# Patient Record
Sex: Female | Born: 1945 | Race: White | Hispanic: No | Marital: Married | Smoking: Never smoker
Health system: Southern US, Community
[De-identification: ages and names within clinical notes are randomized; demographics above are authoritative.]

## PROBLEM LIST (undated history)

## (undated) DIAGNOSIS — Z8701 Personal history of pneumonia (recurrent): Secondary | ICD-10-CM

## (undated) DIAGNOSIS — N184 Chronic kidney disease, stage 4 (severe): Secondary | ICD-10-CM

## (undated) DIAGNOSIS — E785 Hyperlipidemia, unspecified: Secondary | ICD-10-CM

## (undated) DIAGNOSIS — M549 Dorsalgia, unspecified: Secondary | ICD-10-CM

## (undated) DIAGNOSIS — E119 Type 2 diabetes mellitus without complications: Secondary | ICD-10-CM

## (undated) DIAGNOSIS — E039 Hypothyroidism, unspecified: Secondary | ICD-10-CM

## (undated) DIAGNOSIS — I739 Peripheral vascular disease, unspecified: Secondary | ICD-10-CM

## (undated) DIAGNOSIS — H3563 Retinal hemorrhage, bilateral: Secondary | ICD-10-CM

## (undated) DIAGNOSIS — I10 Essential (primary) hypertension: Secondary | ICD-10-CM

## (undated) DIAGNOSIS — I219 Acute myocardial infarction, unspecified: Secondary | ICD-10-CM

## (undated) DIAGNOSIS — I2510 Atherosclerotic heart disease of native coronary artery without angina pectoris: Secondary | ICD-10-CM

## (undated) DIAGNOSIS — C439 Malignant melanoma of skin, unspecified: Secondary | ICD-10-CM

## (undated) DIAGNOSIS — M797 Fibromyalgia: Secondary | ICD-10-CM

## (undated) HISTORY — PX: TOTAL ABDOMINAL HYSTERECTOMY: SHX209

## (undated) HISTORY — PX: TUMOR REMOVAL: SHX12

## (undated) HISTORY — PX: BLADDER SUSPENSION: SHX72

## (undated) HISTORY — DX: Type 2 diabetes mellitus without complications: E11.9

## (undated) HISTORY — DX: Hypothyroidism, unspecified: E03.9

## (undated) HISTORY — DX: Hyperlipidemia, unspecified: E78.5

## (undated) HISTORY — DX: Peripheral vascular disease, unspecified: I73.9

## (undated) HISTORY — DX: Atherosclerotic heart disease of native coronary artery without angina pectoris: I25.10

## (undated) HISTORY — DX: Chronic kidney disease, stage 4 (severe): N18.4

## (undated) HISTORY — DX: Personal history of pneumonia (recurrent): Z87.01

## (undated) HISTORY — PX: CHOLECYSTECTOMY: SHX55

## (undated) HISTORY — PX: NEUROPLASTY / TRANSPOSITION MEDIAN NERVE AT CARPAL TUNNEL BILATERAL: SUR894

## (undated) HISTORY — PX: CORONARY ANGIOPLASTY: SHX604

## (undated) HISTORY — DX: Fibromyalgia: M79.7

## (undated) HISTORY — DX: Essential (primary) hypertension: I10

## (undated) HISTORY — PX: CORONARY STENT PLACEMENT: SHX1402

---

## 1999-12-28 ENCOUNTER — Inpatient Hospital Stay (HOSPITAL_COMMUNITY): Admission: RE | Admit: 1999-12-28 | Discharge: 1999-12-29 | Payer: Self-pay | Admitting: Cardiology

## 2001-01-29 ENCOUNTER — Ambulatory Visit (HOSPITAL_COMMUNITY): Admission: RE | Admit: 2001-01-29 | Discharge: 2001-01-30 | Payer: Self-pay | Admitting: Cardiology

## 2002-05-13 ENCOUNTER — Encounter: Payer: Self-pay | Admitting: Cardiology

## 2002-05-13 ENCOUNTER — Inpatient Hospital Stay (HOSPITAL_COMMUNITY): Admission: EM | Admit: 2002-05-13 | Discharge: 2002-05-18 | Payer: Self-pay | Admitting: Cardiology

## 2003-10-13 ENCOUNTER — Inpatient Hospital Stay (HOSPITAL_COMMUNITY): Admission: AD | Admit: 2003-10-13 | Discharge: 2003-10-15 | Payer: Self-pay | Admitting: Cardiology

## 2004-04-02 ENCOUNTER — Ambulatory Visit: Payer: Self-pay | Admitting: Cardiology

## 2005-03-26 ENCOUNTER — Ambulatory Visit: Payer: Self-pay | Admitting: Cardiovascular Disease

## 2005-03-26 ENCOUNTER — Inpatient Hospital Stay (HOSPITAL_COMMUNITY): Admission: AD | Admit: 2005-03-26 | Discharge: 2005-03-27 | Payer: Self-pay | Admitting: Cardiovascular Disease

## 2005-04-11 ENCOUNTER — Ambulatory Visit: Payer: Self-pay | Admitting: Cardiology

## 2005-11-04 ENCOUNTER — Ambulatory Visit: Payer: Self-pay | Admitting: Cardiology

## 2005-11-14 ENCOUNTER — Ambulatory Visit: Payer: Self-pay | Admitting: Cardiology

## 2005-12-04 ENCOUNTER — Encounter: Payer: Self-pay | Admitting: Physician Assistant

## 2006-02-28 ENCOUNTER — Inpatient Hospital Stay (HOSPITAL_COMMUNITY): Admission: AD | Admit: 2006-02-28 | Discharge: 2006-03-02 | Payer: Self-pay | Admitting: Cardiovascular Disease

## 2006-02-28 ENCOUNTER — Ambulatory Visit: Payer: Self-pay | Admitting: Cardiology

## 2006-03-10 ENCOUNTER — Ambulatory Visit: Payer: Self-pay | Admitting: Cardiology

## 2006-04-16 ENCOUNTER — Ambulatory Visit: Payer: Self-pay

## 2006-04-22 ENCOUNTER — Ambulatory Visit: Payer: Self-pay | Admitting: Cardiology

## 2006-04-30 ENCOUNTER — Ambulatory Visit: Payer: Self-pay | Admitting: Cardiology

## 2006-05-14 ENCOUNTER — Ambulatory Visit: Payer: Self-pay | Admitting: Cardiology

## 2006-12-08 ENCOUNTER — Ambulatory Visit: Payer: Self-pay | Admitting: Cardiology

## 2006-12-15 ENCOUNTER — Ambulatory Visit: Payer: Self-pay | Admitting: Cardiology

## 2006-12-17 ENCOUNTER — Ambulatory Visit: Payer: Self-pay | Admitting: Cardiology

## 2007-03-07 IMAGING — CR DG CHEST 1V PORT
1 series · 1 of 1 positions shown · non-contrast
Comparison: 10/13/03.

CLINICAL DATA: Pre-cath. Chest pain. 
 PORTABLE CHEST - [id number] ? 03/26/05:

[view not recorded]
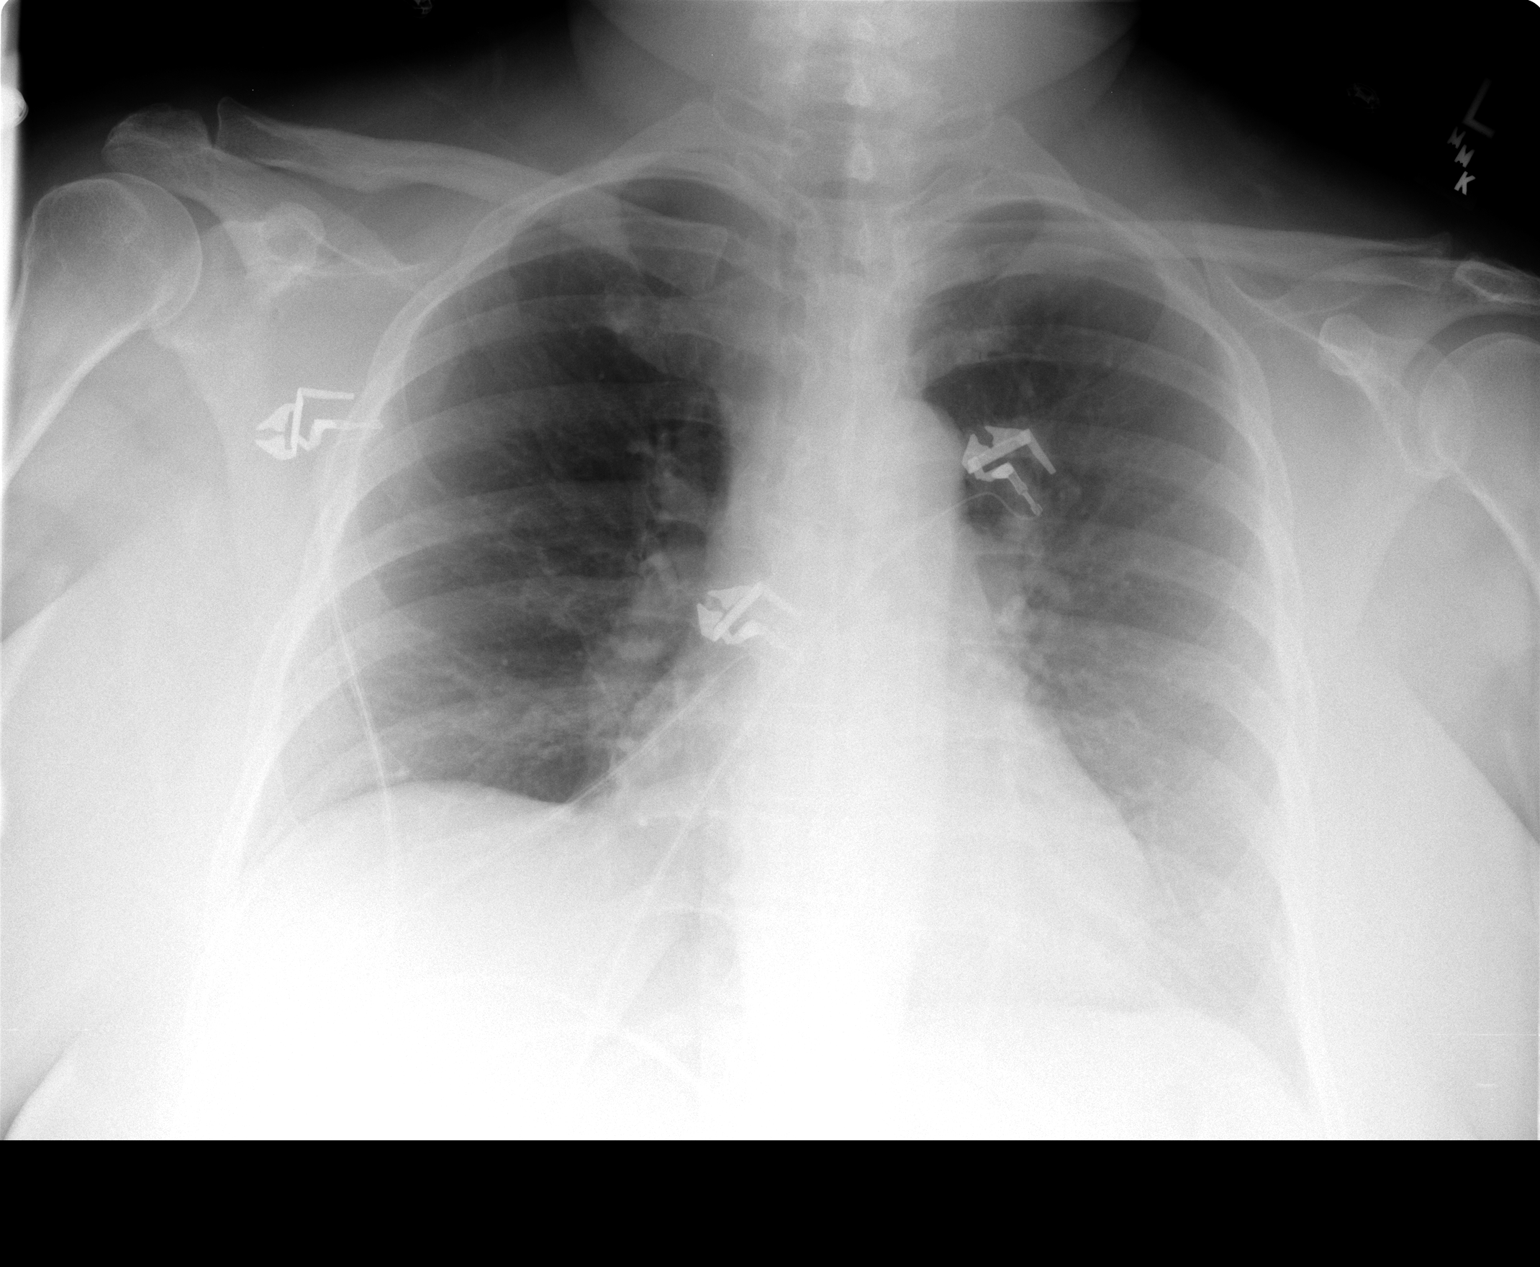

[1 of 1 positions shown; findings below may reference images not displayed]

FINDINGS: The heart size is normal. There are no effusions or edema. No focal airspace disease is noted.
IMPRESSION: No active cardiopulmonary disease.

## 2007-04-09 ENCOUNTER — Ambulatory Visit: Payer: Self-pay | Admitting: Cardiology

## 2007-09-28 ENCOUNTER — Ambulatory Visit: Payer: Self-pay | Admitting: Cardiology

## 2007-12-10 ENCOUNTER — Encounter: Payer: Self-pay | Admitting: Cardiology

## 2007-12-11 ENCOUNTER — Inpatient Hospital Stay (HOSPITAL_COMMUNITY): Admission: AD | Admit: 2007-12-11 | Discharge: 2007-12-12 | Payer: Self-pay | Admitting: Cardiovascular Disease

## 2007-12-11 ENCOUNTER — Encounter: Payer: Self-pay | Admitting: Cardiology

## 2007-12-11 ENCOUNTER — Ambulatory Visit: Payer: Self-pay | Admitting: Cardiology

## 2007-12-11 ENCOUNTER — Ambulatory Visit: Payer: Self-pay | Admitting: Cardiovascular Disease

## 2007-12-21 ENCOUNTER — Ambulatory Visit: Payer: Self-pay | Admitting: Cardiology

## 2008-02-19 ENCOUNTER — Encounter: Payer: Self-pay | Admitting: Cardiology

## 2008-04-11 ENCOUNTER — Ambulatory Visit: Payer: Self-pay | Admitting: Cardiology

## 2008-05-05 ENCOUNTER — Encounter: Payer: Self-pay | Admitting: Cardiology

## 2008-05-18 ENCOUNTER — Encounter: Payer: Self-pay | Admitting: Cardiology

## 2008-08-18 ENCOUNTER — Encounter: Payer: Self-pay | Admitting: Cardiology

## 2008-09-07 DIAGNOSIS — I10 Essential (primary) hypertension: Secondary | ICD-10-CM | POA: Insufficient documentation

## 2008-09-07 DIAGNOSIS — I2510 Atherosclerotic heart disease of native coronary artery without angina pectoris: Secondary | ICD-10-CM

## 2008-09-07 DIAGNOSIS — E782 Mixed hyperlipidemia: Secondary | ICD-10-CM

## 2008-11-17 ENCOUNTER — Encounter: Payer: Self-pay | Admitting: Cardiology

## 2008-12-08 ENCOUNTER — Encounter: Payer: Self-pay | Admitting: Cardiology

## 2009-01-20 ENCOUNTER — Ambulatory Visit: Payer: Self-pay | Admitting: Cardiology

## 2009-01-20 ENCOUNTER — Encounter: Payer: Self-pay | Admitting: Physician Assistant

## 2009-01-20 DIAGNOSIS — E119 Type 2 diabetes mellitus without complications: Secondary | ICD-10-CM

## 2009-05-11 ENCOUNTER — Telehealth (INDEPENDENT_AMBULATORY_CARE_PROVIDER_SITE_OTHER): Payer: Self-pay | Admitting: *Deleted

## 2009-05-16 ENCOUNTER — Encounter: Payer: Self-pay | Admitting: Cardiology

## 2009-05-18 ENCOUNTER — Encounter: Payer: Self-pay | Admitting: Cardiology

## 2009-07-11 ENCOUNTER — Encounter: Payer: Self-pay | Admitting: Physician Assistant

## 2009-07-11 ENCOUNTER — Telehealth (INDEPENDENT_AMBULATORY_CARE_PROVIDER_SITE_OTHER): Payer: Self-pay | Admitting: *Deleted

## 2009-07-11 ENCOUNTER — Encounter: Payer: Self-pay | Admitting: Cardiology

## 2009-07-12 ENCOUNTER — Inpatient Hospital Stay (HOSPITAL_COMMUNITY): Admission: EM | Admit: 2009-07-12 | Discharge: 2009-07-13 | Payer: Self-pay | Admitting: Internal Medicine

## 2009-07-12 ENCOUNTER — Ambulatory Visit: Payer: Self-pay | Admitting: Cardiology

## 2009-07-12 ENCOUNTER — Encounter: Payer: Self-pay | Admitting: Cardiology

## 2009-07-12 ENCOUNTER — Ambulatory Visit: Payer: Self-pay | Admitting: Cardiovascular Disease

## 2009-07-13 ENCOUNTER — Encounter: Payer: Self-pay | Admitting: Cardiology

## 2009-07-17 ENCOUNTER — Ambulatory Visit: Payer: Self-pay | Admitting: Cardiology

## 2009-08-17 ENCOUNTER — Encounter: Payer: Self-pay | Admitting: Cardiology

## 2009-11-16 ENCOUNTER — Encounter: Payer: Self-pay | Admitting: Cardiology

## 2009-11-20 ENCOUNTER — Encounter: Payer: Self-pay | Admitting: Cardiology

## 2009-11-28 ENCOUNTER — Ambulatory Visit: Payer: Self-pay | Admitting: Cardiology

## 2010-01-10 ENCOUNTER — Encounter (INDEPENDENT_AMBULATORY_CARE_PROVIDER_SITE_OTHER): Payer: Self-pay | Admitting: *Deleted

## 2010-02-06 NOTE — Consult Note (Signed)
Summary: CARDIOLOGY CONSULT/ [facility]  CARDIOLOGY CONSULT/ [facility]   Imported By: Zachary George 07/14/2009 14:44:55  _____________________________________________________________________  External Attachment:    Type:   Image     Comment:   External Document

## 2010-02-06 NOTE — Procedures (Signed)
Summary: Holter and Event/ [facility] END OF SERVICE SUMMARY REPORT  Holter and Event/ [facility] END OF SERVICE SUMMARY REPORT   Imported By: Dorise Hiss 01/19/2009 16:13:33  _____________________________________________________________________  External Attachment:    Type:   Image     Comment:   External Document

## 2010-02-06 NOTE — Assessment & Plan Note (Signed)
Summary: 6 mo fu per july reminder-srs   Visit Type:  Follow-up Primary Provider:  Dr. [name]   History of Present Illness: 65 year old woman presents for followup. She was just discharged from [facility] on 7 July after undergoing cardiac catheterization with subsequent placement of overlapping drug-eluting stents in the distal RCA. She had originally presented to [facility] with advancing chest pain symptoms.  Recent laboratory data at discharge included hemoglobin 12.5, platelets 180, potassium 4.1, BUN 11, creatinine 1.1, LDL 49, HDL 48, cholesterol 128, triglycerides 154. We reviewed her lipids. She states that she is taking both over-the-counter omega-3 supplements as well as Lovaza.  Symptomatically she feels much better. She denies any discomfort at her cardiac catheterization site.  Preventive Screening-Counseling & Management  Alcohol-Tobacco     Smoking Status: never  Current Medications (verified): 1)  Potassium Chloride Crys Cr 20 Meq Cr-Tabs (Potassium Chloride Crys Cr) .... Take One Tablet By Mouth Daily 2)  Vitamin C 500 Mg Tabs (Ascorbic Acid) .... Take 1 Tablet By Mouth Once A Day 3)  Vitamin D (Ergocalciferol) 50000 Unit Caps (Ergocalciferol) .... One Tablet By Mouth Once A Week 4)  Vitamin E 400 Unit Caps (Vitamin E) .... Take 1 Tablet By Mouth Once A Day 5)  Fish Oil 1000 Mg Caps (Omega-3 Fatty Acids) .... Take 3 By Mouth in Am and 2 At Bedtime 6)  Labetalol Hcl 100 Mg Tabs (Labetalol Hcl) .... Take 1 Tablet By Mouth Two Times A Day 7)  Lantus 100 Unit/ml Soln (Insulin Glargine) .... 42 Units At Bedtime 8)  Apidra 100 Unit/ml Soln (Insulin Glulisine) .Marland Kitchen.. 12 Units If Blood Sugar > 200 9)  Glyburide Micronized 6 Mg Tabs (Glyburide Micronized) .... Take 1 Tablet By Mouth Once A Day 10)  Metformin Hcl 1000 Mg Tabs (Metformin Hcl) .... Take 1 Tablet By Mouth Once A Day 11)  Levothyroxine Sodium 150 Mcg Tabs (Levothyroxine Sodium) ....  Take 1 Tablet By Mouth Once A Day 12)  Furosemide 80 Mg Tabs (Furosemide) .... Take 1 Tablet By Mouth Once A Day 13)  Diovan 320 Mg Tabs (Valsartan) .... Take 1 Tablet By Mouth Once A Day 14)  Clonidine Hcl 0.3 Mg Tabs (Clonidine Hcl) .... Take 1 Tablet By Mouth Two Times A Day 15)  Norvasc 10 Mg Tabs (Amlodipine Besylate) .... Take 1 Tablet By Mouth Once A Day 16)  Imdur 60 Mg Xr24h-Tab (Isosorbide Mononitrate) .... Take 1 Tablet By Mouth Once A Day 17)  Carvedilol 25 Mg Tabs (Carvedilol) .... Take 1 Tablet By Mouth Two Times A Day 18)  Plavix 75 Mg Tabs (Clopidogrel Bisulfate) .... Take One Tablet By Mouth Daily 19)  Crestor 5 Mg Tabs (Rosuvastatin Calcium) .... Take One Tablet By Mouth Daily. 20)  Aspirin Ec 325 Mg Tbec (Aspirin) .... Take 1 Tablet By Mouth Once A Day 21)  Niaspan 500 Mg Cr-Tabs (Niacin (Antihyperlipidemic)) .... Take 1 Tablet By Mouth Once A Day 22)  Nabumetone 750 Mg Tabs (Nabumetone) .... Take 1 Tablet By Mouth Two Times A Day 23)  Estrace 2 Mg Tabs (Estradiol) .... Take 1 Tablet By Mouth Once A Day 24)  Nitroglycerin 0.4 Mg Subl (Nitroglycerin) .... One Tablet Under Tongue Every 5 Minutes As Needed For Chest Pain---May Repeat Times Three 25)  Amitriptyline Hcl 100 Mg Tabs (Amitriptyline Hcl) .... Take 1 Tab By Mouth At Bedtime 26)  Lovaza 1 Gm Caps (Omega-3-Acid Ethyl Esters) .... Take 1 Tablet By Mouth Once A Day 27)  Amitriptyline Hcl 10 Mg Tabs (Amitriptyline Hcl) .... Take 1 Tablet By Mouth Once A Day At Bedtime 28)  Miralax  Powd (Polyethylene Glycol 3350) .... Take 17gm By Mouth in 8oz of Water or Juice Daily  Allergies (verified): 1)  ! Sulfa  Comments:  Nurse/Medical Assistant: The patient's medication list and allergies were reviewed with the patient and were updated in the Medication and Allergy Lists.  Past History:  Social History: Last updated: 01/20/2009 Retired  Married  Tobacco Use - No Alcohol Use - no Regular Exercise - no Drug Use -  no  Past Medical History: Fibromyalgia Hypothyroidism Diabetes type 2 CAD - BMS circ/RCA 2001, BMS RCA 2003, PTCA RCA/ramus 2004, DES RCA 2005, DES RCA 2007, DES OM/LAD 2009, DES RCA 7/11, LVEF 65% Hyperlipidemia Hypertension  Review of Systems  The patient denies anorexia, fever, syncope, dyspnea on exertion, peripheral edema, prolonged cough, hemoptysis, melena, and hematochezia.         Otherwise reviewed and negative.  Vital Signs:  Patient profile:   65 year old female Height:      62 inches Weight:      223 pounds Pulse rate:   56 / minute BP sitting:   136 / 82  (left arm) Cuff size:   large  Vitals Entered By: Carlye Grippe (July 17, 2009 9:30 AM)  Physical Exam  Additional Exam:  GEN: 65 year old female, obese, sitting upright, in no distress HEENT: NCAT,PERRLA,EOMI NECK: palpable pulses, no bruits; no JVD; no TM LUNGS: CTA bilaterally HEART: RRR (S1S2); no significant murmurs; no rubs; no gallops ABD: soft, NT; intact BS EXT: 1+ bilateral lower extremity edema SKIN: warm, dry MUSC: no obvious deformity NEURO: A/O (x3)      Cardiac Cath  Procedure date:  07/13/2009  Findings:      ANGIOGRAPHIC FINDINGS:   1. Left main coronary artery had no obstructive disease.   2. The left anterior descending had a long segment of 50% to 60%       stenosis in the proximal segment.  The mid vessel had diffuse 30%       plaque.  The distal vessel had a stent that was patent without any       restenosis.  The first 2 diagonal branches were very small in       caliber.  The third diagonal branch was a small to moderate-sized       vessel with mild plaque disease.   3. Circumflex artery had a proximal 30% stenosis.  The first obtuse       marginal was very small in caliber.  The second obtuse marginal was       moderate-to-large sized and had a patent stent.  The AV groove       circumflex bifurcated into a third obtuse marginal which had a       patent stent.  The AV  groove circumflex had a 90% ostial stenosis       just after the takeoff of the third obtuse marginal branch.  This       is most likely because of the stent that was placed across this       branch.  This is unchanged from prior catheterization.   4. The right coronary artery is a large dominant vessel with area of       diffuse stenting throughout the proximal and mid vessel.  The       stents angiographically appeared to overlap.  Beyond the  stented       segment in the distal portion of the vessel, there are two serial       80% lesions.  These lesions have progressed in severity since the       last catheterization.  The distal RCA bifurcates into the       posterolateral segment which has mild plaque disease and a small-       caliber posterior descending artery that has a 99% stenosis.  This       vessel is less than 1.0 mm and too small for intervention.  This       vessel is unchanged in appearance since the last catheterization.   5. Left ventricular angiogram was performed in the RAO projection       which showed normal left ventricular systolic function with       ejection fraction of 65%.  Impression & Recommendations:  Problem # 1:  CAD, NATIVE VESSEL (ICD-414.01)  Status post multiple percutaneous interventions, most recently with overlapping drug-eluting stent placement to the distal RCA last week. LVEF remains normal. She is symptomatically improved on medical therapy otherwise, and I plan to see her back in 3 months.  Her updated medication list for this problem includes:    Labetalol Hcl 100 Mg Tabs (Labetalol hcl) .Marland Kitchen... Take 1 tablet by mouth two times a day    Norvasc 10 Mg Tabs (Amlodipine besylate) .Marland Kitchen... Take 1 tablet by mouth once a day    Imdur 60 Mg Xr24h-tab (Isosorbide mononitrate) .Marland Kitchen... Take 1 tablet by mouth once a day    Carvedilol 25 Mg Tabs (Carvedilol) .Marland Kitchen... Take 1 tablet by mouth two times a day    Plavix 75 Mg Tabs (Clopidogrel bisulfate) .Marland Kitchen... Take  one tablet by mouth daily    Aspirin Ec 325 Mg Tbec (Aspirin) .Marland Kitchen... Take 1 tablet by mouth once a day    Nitroglycerin 0.4 Mg Subl (Nitroglycerin) ..... One tablet under tongue every 5 minutes as needed for chest pain---may repeat times three  Problem # 2:  HYPERLIPIDEMIA-MIXED (ICD-272.4)  Recent lipid profile reviewed. I have recommended that she stop Lovaza for cost savings, and otherwise continue generic omega-3 supplements. She seems to be tolerating these well. Followup lipid profile and liver function tests prior to her next visit.  The following medications were removed from the medication list:    Lovaza 1 Gm Caps (Omega-3-acid ethyl esters) .Marland Kitchen... Take 1 tablet by mouth once a day Her updated medication list for this problem includes:    Crestor 5 Mg Tabs (Rosuvastatin calcium) .Marland Kitchen... Take one tablet by mouth daily.    Niaspan 500 Mg Cr-tabs (Niacin (antihyperlipidemic)) .Marland Kitchen... Take 1 tablet by mouth once a day  Problem # 3:  HYPERTENSION, UNSPECIFIED (ICD-401.9)  Blood pressure is reasonable today.  Her updated medication list for this problem includes:    Labetalol Hcl 100 Mg Tabs (Labetalol hcl) .Marland Kitchen... Take 1 tablet by mouth two times a day    Furosemide 80 Mg Tabs (Furosemide) .Marland Kitchen... Take 1 tablet by mouth once a day    Diovan 320 Mg Tabs (Valsartan) .Marland Kitchen... Take 1 tablet by mouth once a day    Clonidine Hcl 0.3 Mg Tabs (Clonidine hcl) .Marland Kitchen... Take 1 tablet by mouth two times a day    Norvasc 10 Mg Tabs (Amlodipine besylate) .Marland Kitchen... Take 1 tablet by mouth once a day    Carvedilol 25 Mg Tabs (Carvedilol) .Marland Kitchen... Take 1 tablet by mouth two times a day  Aspirin Ec 325 Mg Tbec (Aspirin) .Marland Kitchen... Take 1 tablet by mouth once a day  Problem # 4:  DM (ICD-250.00)  Followed by Dr. Charm Barges.  Her updated medication list for this problem includes:    Lantus 100 Unit/ml Soln (Insulin glargine) .Marland Kitchen... 42 units at bedtime    Apidra 100 Unit/ml Soln (Insulin glulisine) .Marland Kitchen... 12 units if blood sugar >  200    Glyburide Micronized 6 Mg Tabs (Glyburide micronized) .Marland Kitchen... Take 1 tablet by mouth once a day    Metformin Hcl 1000 Mg Tabs (Metformin hcl) .Marland Kitchen... Take 1 tablet by mouth once a day    Diovan 320 Mg Tabs (Valsartan) .Marland Kitchen... Take 1 tablet by mouth once a day    Aspirin Ec 325 Mg Tbec (Aspirin) .Marland Kitchen... Take 1 tablet by mouth once a day  Patient Instructions: 1)  Stop Lovaza. 2)  Your physician recommends that you go to the [facility] for a FASTING lipid profile and liver function labs:  DO BEFORE OFFICE VISIT IN 3 MONTHS. 3)  Your physician wants you to follow-up in: 3 months. You will receive a reminder letter in the mail one-two months in advance. If you don't receive a letter, please call our office to schedule the follow-up appointment.

## 2010-02-06 NOTE — Assessment & Plan Note (Signed)
Summary: 3 mo fu per oct reminder   Visit Type:  Follow-up Primary Provider:  Dr. [name]   History of Present Illness: 65 year old woman presents for followup. She was seen back in July. Reports no new or progressive symptoms. She has occasional angina, not requiring nitroglycerin. Some fatigue.  She reports compliance with her medications. She had recent lab work done 114 November, hemoglobin 13.6, platelets 2:30, BUN 24, creatinine 1.2, AST 19, ALT 19, potassium 5.1, TSH 2.4. No lipids obtained.  No reported bleeding problems on dual antiplatelet therapy. Blood pressure is up today. She has not taken her morning medicines as yet. We discussed sodium restriction.  Clinical Review Panels:  Cardiac Imaging Cardiac Cath Findings ANGIOGRAPHIC FINDINGS:   1. Left main coronary artery had no obstructive disease.   2. The left anterior descending had a long segment of 50% to 60%       stenosis in the proximal segment.  The mid vessel had diffuse 30%       plaque.  The distal vessel had a stent that was patent without any       restenosis.  The first 2 diagonal branches were very small in       caliber.  The third diagonal branch was a small to moderate-sized       vessel with mild plaque disease.   3. Circumflex artery had a proximal 30% stenosis.  The first obtuse       marginal was very small in caliber.  The second obtuse marginal was       moderate-to-large sized and had a patent stent.  The AV groove       circumflex bifurcated into a third obtuse marginal which had a       patent stent.  The AV groove circumflex had a 90% ostial stenosis       just after the takeoff of the third obtuse marginal branch.  This       is most likely because of the stent that was placed across this       branch.  This is unchanged from prior catheterization.   4. The right coronary artery is a large dominant vessel with area of       diffuse stenting throughout the proximal and mid vessel.  The         stents angiographically appeared to overlap.  Beyond the stented       segment in the distal portion of the vessel, there are two serial       80% lesions.  These lesions have progressed in severity since the       last catheterization.  The distal RCA bifurcates into the       posterolateral segment which has mild plaque disease and a small-       caliber posterior descending artery that has a 99% stenosis.  This       vessel is less than 1.0 mm and too small for intervention.  This       vessel is unchanged in appearance since the last catheterization.   5. Left ventricular angiogram was performed in the RAO projection       which showed normal left ventricular systolic function with       ejection fraction of 65%. (07/13/2009)    Preventive Screening-Counseling & Management  Alcohol-Tobacco     Smoking Status: never  Current Medications (verified): 1)  Potassium Chloride Crys Cr 20 Meq Cr-Tabs (Potassium Chloride Crys Cr) .Marland KitchenMarland KitchenMarland Kitchen  Take One Tablet By Mouth Daily 2)  Vitamin C 500 Mg Tabs (Ascorbic Acid) .... Take 1 Tablet By Mouth Once A Day 3)  Vitamin D (Ergocalciferol) 50000 Unit Caps (Ergocalciferol) .... One Tablet By Mouth Once A Week 4)  Vitamin E 400 Unit Caps (Vitamin E) .... Take 1 Tablet By Mouth Once A Day 5)  Fish Oil 1000 Mg Caps (Omega-3 Fatty Acids) .... Take 3 By Mouth in Am and 2 At Bedtime 6)  Labetalol Hcl 100 Mg Tabs (Labetalol Hcl) .... Take 1 Tablet By Mouth Two Times A Day 7)  Lantus 100 Unit/ml Soln (Insulin Glargine) .... 42 Units At Bedtime 8)  Apidra 100 Unit/ml Soln (Insulin Glulisine) .Marland Kitchen.. 12 Units If Blood Sugar > 200 9)  Glyburide Micronized 6 Mg Tabs (Glyburide Micronized) .... Take 1 Tablet By Mouth Once A Day 10)  Metformin Hcl 1000 Mg Tabs (Metformin Hcl) .... Take 1 Tablet By Mouth Once A Day 11)  Levothyroxine Sodium 150 Mcg Tabs (Levothyroxine Sodium) .... Take 1 Tablet By Mouth Once A Day 12)  Furosemide 80 Mg Tabs (Furosemide) .... Take 1  Tablet By Mouth Once A Day 13)  Diovan 320 Mg Tabs (Valsartan) .... Take 1 Tablet By Mouth Once A Day 14)  Clonidine Hcl 0.3 Mg Tabs (Clonidine Hcl) .... Take 1 Tablet By Mouth Two Times A Day 15)  Norvasc 10 Mg Tabs (Amlodipine Besylate) .... Take 1 Tablet By Mouth Once A Day 16)  Imdur 60 Mg Xr24h-Tab (Isosorbide Mononitrate) .... Take 1 Tablet By Mouth Once A Day 17)  Carvedilol 25 Mg Tabs (Carvedilol) .... Take 1 Tablet By Mouth Two Times A Day 18)  Plavix 75 Mg Tabs (Clopidogrel Bisulfate) .... Take One Tablet By Mouth Daily 19)  Crestor 5 Mg Tabs (Rosuvastatin Calcium) .... Take One Tablet By Mouth Daily. 20)  Aspirin Ec 325 Mg Tbec (Aspirin) .... Take 1 Tablet By Mouth Once A Day 21)  Niaspan 500 Mg Cr-Tabs (Niacin (Antihyperlipidemic)) .... Take 1 Tablet By Mouth Once A Day 22)  Nabumetone 750 Mg Tabs (Nabumetone) .... Take 1 Tablet By Mouth Two Times A Day 23)  Estrace 2 Mg Tabs (Estradiol) .... Take 1 Tablet By Mouth Once A Day 24)  Nitroglycerin 0.4 Mg Subl (Nitroglycerin) .... One Tablet Under Tongue Every 5 Minutes As Needed For Chest Pain---May Repeat Times Three 25)  Amitriptyline Hcl 100 Mg Tabs (Amitriptyline Hcl) .... Take 1 Tab By Mouth At Bedtime 26)  Amitriptyline Hcl 10 Mg Tabs (Amitriptyline Hcl) .... Take 1 Tablet By Mouth Once A Day At Bedtime 27)  Miralax  Powd (Polyethylene Glycol 3350) .... Take 17gm By Mouth in 8oz of Water or Juice Daily 28)  Lovaza 1 Gm Caps (Omega-3-Acid Ethyl Esters) .... Take 1 Tablet By Mouth Once A Day  Allergies (verified): 1)  ! Sulfa  Comments:  Nurse/Medical Assistant: The patient's medication list and allergies were reviewed with the patient and were updated in the Medication and Allergy Lists.  Past History:  Past Medical History: Last updated: 07/17/2009 Fibromyalgia Hypothyroidism Diabetes type 2 CAD - BMS circ/RCA 2001, BMS RCA 2003, PTCA RCA/ramus 2004, DES RCA 2005, DES RCA 2007, DES OM/LAD 2009, DES RCA 7/11, LVEF  65% Hyperlipidemia Hypertension  Social History: Last updated: 01/20/2009 Retired  Married  Tobacco Use - No Alcohol Use - no Regular Exercise - no Drug Use - no  Review of Systems       The patient complains of dyspnea on exertion.  The  patient denies anorexia, fever, weight loss, syncope, peripheral edema, prolonged cough, hemoptysis, melena, and hematochezia.         Otherwise reviewed and negative except as outlined.  Vital Signs:  Patient profile:   65 year old female Height:      62 inches Weight:      215 pounds BMI:     39.47 Pulse rate:   65 / minute BP sitting:   164 / 78  (left arm) Cuff size:   large  Vitals Entered By: Carlye Grippe (November 28, 2009 8:39 AM)  Nutrition Counseling: Patient's BMI is greater than 25 and therefore counseled on weight management options.  Physical Exam  Additional Exam:  GEN: 65 year old female, obese, sitting upright, in no distress HEENT: NCAT,PERRLA,EOMI NECK: palpable pulses, no bruits; no JVD; no TM LUNGS: CTA bilaterally HEART: RRR (S1S2); no significant murmurs; no rubs; no gallops ABD: soft, NT; intact BS EXT: 1+ bilateral lower extremity edema SKIN: warm, dry MUSC: no obvious deformity NEURO: A/O (x3)      Impression & Recommendations:  Problem # 1:  CAD, NATIVE VESSEL (ICD-414.01)  Symptomatically stable at this point on medical therapy. We discussed warning signs and continued observation at this point, followup in 3 months.  Her updated medication list for this problem includes:    Labetalol Hcl 100 Mg Tabs (Labetalol hcl) .Marland Kitchen... Take 1 tablet by mouth two times a day    Norvasc 10 Mg Tabs (Amlodipine besylate) .Marland Kitchen... Take 1 tablet by mouth once a day    Imdur 60 Mg Xr24h-tab (Isosorbide mononitrate) .Marland Kitchen... Take 1 tablet by mouth once a day    Carvedilol 25 Mg Tabs (Carvedilol) .Marland Kitchen... Take 1 tablet by mouth two times a day    Plavix 75 Mg Tabs (Clopidogrel bisulfate) .Marland Kitchen... Take one tablet by mouth daily     Aspirin Ec 325 Mg Tbec (Aspirin) .Marland Kitchen... Take 1 tablet by mouth once a day    Nitroglycerin 0.4 Mg Subl (Nitroglycerin) ..... One tablet under tongue every 5 minutes as needed for chest pain---may repeat times three  Problem # 2:  HYPERTENSION, UNSPECIFIED (ICD-401.9)  Blood pressure up today, however patient has not yet taken her morning medicines. No changes made.  Her updated medication list for this problem includes:    Labetalol Hcl 100 Mg Tabs (Labetalol hcl) .Marland Kitchen... Take 1 tablet by mouth two times a day    Furosemide 80 Mg Tabs (Furosemide) .Marland Kitchen... Take 1 tablet by mouth once a day    Diovan 320 Mg Tabs (Valsartan) .Marland Kitchen... Take 1 tablet by mouth once a day    Clonidine Hcl 0.3 Mg Tabs (Clonidine hcl) .Marland Kitchen... Take 1 tablet by mouth two times a day    Norvasc 10 Mg Tabs (Amlodipine besylate) .Marland Kitchen... Take 1 tablet by mouth once a day    Carvedilol 25 Mg Tabs (Carvedilol) .Marland Kitchen... Take 1 tablet by mouth two times a day    Aspirin Ec 325 Mg Tbec (Aspirin) .Marland Kitchen... Take 1 tablet by mouth once a day  Problem # 3:  HYPERLIPIDEMIA-MIXED (ICD-272.4)  Recent liver function tests are normal. Tolerating Crestor. Plan followup lipid profile for her next visit.  Her updated medication list for this problem includes:    Crestor 5 Mg Tabs (Rosuvastatin calcium) .Marland Kitchen... Take one tablet by mouth daily.    Niaspan 500 Mg Cr-tabs (Niacin (antihyperlipidemic)) .Marland Kitchen... Take 1 tablet by mouth once a day    Lovaza 1 Gm Caps (Omega-3-acid ethyl esters) .Marland Kitchen... Take 1 tablet by  mouth once a day  Patient Instructions: 1)  Your physician wants you to follow-up in: 3 months. You will receive a reminder letter in the mail one-two months in advance. If you don't receive a letter, please call our office to schedule the follow-up appointment. 2)  Your physician recommends that you go to the [facility] for a FASTING lipid profile and liver function labs:  DO IN 3 MONTHS BEFORE YOUR APPT WITH DR. MCDOWELL. 3)  Your physician recommends  that you continue on your current medications as directed. Please refer to the Current Medication list given to you today.

## 2010-02-06 NOTE — Progress Notes (Signed)
Summary: chest pain   Phone Note Other Incoming   Caller: husband Summary of Call: c/o of wife having chest pain and pressure over last couple days.  Does have 11 stents per husband.  Also, has appt. with SM on 7/11.  Stated that she has taken one NTG this a.m. with no relief.  Advised husband to take pt. to ED for evaluation.  Husband verbalized understanding. Hoover Brunette, LPN  July 11, 2593 9:12 AM    Follow-up for Phone Call        Reviewed. Follow-up by: Loreli Slot, MD, [facility],  July 11, 2009 5:03 PM

## 2010-02-06 NOTE — Assessment & Plan Note (Signed)
Summary: 6 mo fu -srs   Visit Type:  Follow-up Primary Provider:  Dr. [name]   History of Present Illness: 65 year old woman with multivessel cardiovascular disease presents for a followup, last seen in April 2010.  Labs from November of 2010 revealed a total cholesterol of 135, LDL 46, HDL 49, triglycerides 202. BUN and creatinine were 20 and 1.4 respectively with a potassium of 4.8, AST 18, and ALT 19. TSH was 1.17. Followup labs in December 2010 showed an increase in BUN and creatinine to 25 and 1.6 respectively, potassium 4.9, hemoglobin 12.6.  Since last seen here in the clinic, Ms. Daquila reports that she is doing "really well". She denies any interim development of exertional angina pectoris. She has mild, stable exertional dyspnea. She follows regularly with Dr. Kristian Covey every 4 months, for management of chronic kidney disease and refractory hypertension. She states that her blood pressure has significantly improved, since he placed her on labetalol. es  Preventive Screening-Counseling & Management  Alcohol-Tobacco     Smoking Status: never  Current Medications (verified): 1)  Potassium Chloride Crys Cr 20 Meq Cr-Tabs (Potassium Chloride Crys Cr) .... Take One Tablet By Mouth Daily 2)  Vitamin C 500 Mg Tabs (Ascorbic Acid) .... Take 1 Tablet By Mouth Once A Day 3)  Vitamin D (Ergocalciferol) 50000 Unit Caps (Ergocalciferol) .... One Tablet By Mouth Twice A Week 4)  Vitamin E 400 Unit Caps (Vitamin E) .... Take 1 Tablet By Mouth Once A Day 5)  Fish Oil 1000 Mg Caps (Omega-3 Fatty Acids) .... Take 2 Tablet By Mouth Two Times A Day 6)  Labetalol Hcl 100 Mg Tabs (Labetalol Hcl) .... Take 1 Tablet By Mouth Two Times A Day 7)  Lantus 100 Unit/ml Soln (Insulin Glargine) .... 42 Units At Bedtime 8)  Apidra 100 Unit/ml Soln (Insulin Glulisine) .Marland Kitchen.. 12 Units If Blood Sugar > 200 9)  Glyburide Micronized 6 Mg Tabs (Glyburide Micronized) .... Take 1 Tablet By Mouth Once A Day 10)   Metformin Hcl 1000 Mg Tabs (Metformin Hcl) .... Take 1 Tablet By Mouth Once A Day 11)  Levothyroxine Sodium 150 Mcg Tabs (Levothyroxine Sodium) .... Take 1 Tablet By Mouth Once A Day 12)  Furosemide 80 Mg Tabs (Furosemide) .... Take 1 Tablet By Mouth Once A Day 13)  Diovan 320 Mg Tabs (Valsartan) .... Take 1 Tablet By Mouth Once A Day 14)  Clonidine Hcl 0.3 Mg Tabs (Clonidine Hcl) .... Take 1 Tablet By Mouth Two Times A Day 15)  Norvasc 10 Mg Tabs (Amlodipine Besylate) .... Take 1 Tablet By Mouth Once A Day 16)  Imdur 60 Mg Xr24h-Tab (Isosorbide Mononitrate) .... Take 1 Tablet By Mouth Once A Day 17)  Carvedilol 25 Mg Tabs (Carvedilol) .... Take 1 Tablet By Mouth Two Times A Day 18)  Plavix 75 Mg Tabs (Clopidogrel Bisulfate) .... Take One Tablet By Mouth Daily 19)  Crestor 5 Mg Tabs (Rosuvastatin Calcium) .... Take One Tablet By Mouth Daily. 20)  Aspirin Ec 325 Mg Tbec (Aspirin) .... Take One Tablet By Mouth Daily 21)  Niaspan 500 Mg Cr-Tabs (Niacin (Antihyperlipidemic)) .... Take 1 Tablet By Mouth Once A Day 22)  Nabumetone 750 Mg Tabs (Nabumetone) .... Take 1 Tablet By Mouth Two Times A Day 23)  Estrace 2 Mg Tabs (Estradiol) .... Take 1 Tablet By Mouth Once A Day 24)  Nitroglycerin 0.4 Mg Subl (Nitroglycerin) .... One Tablet Under Tongue Every 5 Minutes As Needed For Chest Pain---May Repeat Times Three 25)  Amitriptyline Hcl 100 Mg Tabs (Amitriptyline Hcl) .... Take 1 Tab By Mouth At Bedtime 26)  Lovaza 1 Gm Caps (Omega-3-Acid Ethyl Esters) .... Take 1 Tablet By Mouth Once A Day  Allergies: 1)  ! Sulfa  Past History:  Past Medical History: Last updated: 01/20/2009 Fibromyalgia Hypothyroidism Diabetes type 2 CAD - BMS circ/RCA 2001, BMS RCA 2003, PTCA RCA/ramus 2004, DES RCA 2005, DES RCA 2007, DES OM/LAD 2009, LVEF 65% Hyperlipidemia Hypertension  Social History: Last updated: 01/20/2009 Retired  Married  Tobacco Use - No Alcohol Use - no Regular Exercise - no Drug Use -  no  Review of Systems       No fevers, chills, hemoptysis, dysphagia, melena, hematocheezia, hematuria, rash, claudication, orthopnea, pnd. Mild, stable chronic pedal edema. All other systems reviewed, and are negative.   Vital Signs:  Patient profile:   65 year old female Height:      62 inches Weight:      219.50 pounds BMI:     40.29 Pulse rate:   49 / minute BP sitting:   177 / 78  (right arm) Cuff size:   regular  Vitals Entered By: Hoover Brunette, LPN (January 20, 2009 2:02 PM)  Nutrition Counseling: Patient's BMI is greater than 25 and therefore counseled on weight management options. Is Patient Diabetic? Yes Comments routine follow up, no problems   Physical Exam  Additional Exam:  GEN: 65 year old female, obese, sitting upright, in no distress HEENT: NCAT,PERRLA,EOMI NECK: palpable pulses, no bruits; no JVD; no TM LUNGS: CTA bilaterally HEART: RRR (S1S2); no significant murmurs; no rubs; no gallops ABD: soft, NT; intact BS EXT: 1+ bilateral lower extremity edema SKIN: warm, dry MUSC: no obvious deformity NEURO: A/O (x3)      EKG  Procedure date:  01/20/2009  Findings:      sinus bradycardia with first degree AV block at 53 bpm; normal axis; question prior septal infarct; nonspecific ST changes  Impression & Recommendations:  Problem # 1:  CAD, NATIVE VESSEL (ICD-414.01)  Patient continues to do well since undergoing drug-eluting stenting of the first obtuse marginal branch and mid LAD, December 2009, for treatment of severe 2 vessel CAD with normal LVEF (EF 65%). We'll, therefore, continue current medication regimen, but decrease aspirin initially to 162 mg daily, then 81 mg daily, indefinitely. We'll also continue Plavix for the present time, given that she has done so well. Will schedule a return clinic visit with myself and Dr. [name] in 6 months.  Problem # 2:  HYPERLIPIDEMIA-MIXED (ICD-272.4)  very well controlled on current regimen, which includes  low dose Crestor and omega-3 fish oil. Most recent data from November 2010 notable for an LDL of 46. Continued close monitoring and management, per Dr. Charm Barges, with recommended LDL target of 70, or less, if feasible.  Problem # 3:  HYPERTENSION, UNSPECIFIED (ICD-401.9)  although patient presents with an elevated blood pressure reading today, she assures me that this is generally well controlled for her, with readings of 140-150 systolic. She states that this is much improved, since being placed on labetalol, by Dr. Kristian Covey, with whom she follows regularly. We'll, therefore, defer to Dr. Kristian Covey for ongoing adjustment of her multimodel antihypertensive regimen, particularly in light of underlying chronic kidney disease. We do note, however, that she probably would not be able to tolerate an increase in labetalol, secondary to resting sinus bradycardia.  Problem # 4:  DM (ICD-250.00) Assessment: Comment Only  Other Orders: EKG w/ Interpretation (93000)  Patient Instructions:  1)  Your physician wants you to follow-up in: 6 months. You will receive a reminder letter in the mail one-two months in advance. If you don't receive a letter, please call our office to schedule the follow-up appointment. 2)  Decrease your Aspirin to 162 mg (1/2 of your 325 mg tablet) and then go down to 81mg  once daily.

## 2010-02-06 NOTE — Letter (Signed)
Summary: [facility]: Clearance to Hold Plavix  [facility]: Clearance to Hold Plavix   Imported By: Cyril Loosen, RN, BSN 05/16/2009 09:42:47  _____________________________________________________________________  External Attachment:    Type:   Image     Comment:   External Document

## 2010-02-06 NOTE — Progress Notes (Signed)
Summary: HAVING EPIDURAL/NEED STOP DATE FOR PLAVIX  Phone Note Call from Patient Call back at Home Phone [phone]   Caller: Patient Call For: nurse Summary of Call: patient called and left message on nurses machine that she is planning to have epidural and would like to know if stopping plavix five days prior to procedure is okay?  Dr. Julius Bowels is doing this and his fax number is [phone],he is pain doctor in [place]. Initial call taken by: Carlye Grippe,  May 11, 2009 3:31 PM  Follow-up for Phone Call        She should be able to stop Plavix temporarily for epidural injection as requested.  Last drug eluting stent was in 2009 by records. Follow-up by: Loreli Slot, MD, [facility],  May 12, 2009 8:07 AM  Additional Follow-up for Phone Call Additional follow up Details #1::        Patient informed of the above and this message faxed to Dr. Julius Bowels per patient request.    Additional Follow-up by: Carlye Grippe,  May 15, 2009 2:05 PM

## 2010-02-08 NOTE — Miscellaneous (Signed)
Summary: Orders Update  Clinical Lists Changes  Orders: Added new Test order of T-Lipid Profile ([id number]) - Signed Added new Test order of T-Hepatic Function ([id number]) - Signed 

## 2010-02-08 NOTE — Letter (Signed)
Summary: Generic Engineer, agricultural at [facility] [address]   [place], [place] [address]   Phone: [phone]  Fax: [phone]        January 10, 2010 MRN: [id number]    [facility] [address] [place], [place]  [address]    Dear Ms. Radoncic,   Enclosed is the order for your lab work to have done before your office visit.   Do not eat or drink after midnight.       Sincerely,  Cyril Loosen, RN, BSN  This letter has been electronically signed by your physician.

## 2010-02-19 ENCOUNTER — Encounter (INDEPENDENT_AMBULATORY_CARE_PROVIDER_SITE_OTHER): Payer: Self-pay | Admitting: *Deleted

## 2010-02-20 ENCOUNTER — Encounter: Payer: Self-pay | Admitting: Cardiology

## 2010-02-28 NOTE — Miscellaneous (Signed)
Summary: Orders Update  Clinical Lists Changes  Orders: Added new Test order of T-Lipid Profile ([id number]) - Signed Added new Test order of T-Hepatic Function ([id number]) - Signed 

## 2010-03-01 ENCOUNTER — Encounter: Payer: Self-pay | Admitting: Cardiology

## 2010-03-01 ENCOUNTER — Ambulatory Visit (INDEPENDENT_AMBULATORY_CARE_PROVIDER_SITE_OTHER): Payer: MEDICARE | Admitting: Cardiology

## 2010-03-01 DIAGNOSIS — I2510 Atherosclerotic heart disease of native coronary artery without angina pectoris: Secondary | ICD-10-CM

## 2010-03-01 DIAGNOSIS — E782 Mixed hyperlipidemia: Secondary | ICD-10-CM

## 2010-03-01 DIAGNOSIS — I10 Essential (primary) hypertension: Secondary | ICD-10-CM

## 2010-03-06 NOTE — Assessment & Plan Note (Signed)
Summary: 3 MO F/U PER REMINDER-JM   Visit Type:  Follow-up Primary Provider:  Dr. [name]   History of Present Illness: 65 year old woman presents for followup. She was seen in November 2011. She states that she has been feeling better, with more energy, no significant chest pain. No recent nitroglycerin requirement.  Lab work from 14 February showed AST 15, ALT 16, triglycerides 173, cholesterol 155, HDL 51, LDL 69. She reports compliance with her medications.  She has not been doing any regular exercise. We did talk about gradually increasing her walking.  I reviewed her last cardiac catheterization report, outlined below.  Preventive Screening-Counseling & Management  Alcohol-Tobacco     Smoking Status: never  Current Medications (verified): 1)  Potassium Chloride Crys Cr 20 Meq Cr-Tabs (Potassium Chloride Crys Cr) .... Take One Tablet By Mouth Daily 2)  Vitamin C 500 Mg Tabs (Ascorbic Acid) .... Take 1 Tablet By Mouth Once A Day 3)  Vitamin D (Ergocalciferol) 50000 Unit Caps (Ergocalciferol) .... One Tablet By Mouth Once A Week 4)  Vitamin E 400 Unit Caps (Vitamin E) .... Take 1 Tablet By Mouth Once A Day 5)  Fish Oil 1000 Mg Caps (Omega-3 Fatty Acids) .... Take 3 By Mouth in Am and 2 At Bedtime 6)  Labetalol Hcl 100 Mg Tabs (Labetalol Hcl) .... Take 1 Tablet By Mouth Two Times A Day 7)  Lantus 100 Unit/ml Soln (Insulin Glargine) .... 42 Units At Bedtime 8)  Apidra 100 Unit/ml Soln (Insulin Glulisine) .Marland Kitchen.. 12 Units If Blood Sugar > 200 9)  Glyburide Micronized 6 Mg Tabs (Glyburide Micronized) .... Take 1 Tablet By Mouth Once A Day 10)  Metformin Hcl 1000 Mg Tabs (Metformin Hcl) .... Take 1 Tablet By Mouth Once A Day 11)  Levothyroxine Sodium 150 Mcg Tabs (Levothyroxine Sodium) .... Take 1 Tablet By Mouth Once A Day 12)  Furosemide 80 Mg Tabs (Furosemide) .... Take 1 Tablet By Mouth Once A Day 13)  Diovan 320 Mg Tabs (Valsartan) .... Take 1 Tablet By Mouth Once A Day 14)   Clonidine Hcl 0.3 Mg Tabs (Clonidine Hcl) .... Take 1 Tablet By Mouth Two Times A Day 15)  Norvasc 10 Mg Tabs (Amlodipine Besylate) .... Take 1 Tablet By Mouth Once A Day 16)  Imdur 60 Mg Xr24h-Tab (Isosorbide Mononitrate) .... Take 1 Tablet By Mouth Once A Day 17)  Carvedilol 25 Mg Tabs (Carvedilol) .... Take 1 Tablet By Mouth Two Times A Day 18)  Plavix 75 Mg Tabs (Clopidogrel Bisulfate) .... Take One Tablet By Mouth Daily 19)  Crestor 5 Mg Tabs (Rosuvastatin Calcium) .... Take One Tablet By Mouth Daily. 20)  Aspirin Ec 325 Mg Tbec (Aspirin) .... Take 1 Tablet By Mouth Once A Day 21)  Niaspan 500 Mg Cr-Tabs (Niacin (Antihyperlipidemic)) .... Take 1 Tablet By Mouth Once A Day 22)  Nabumetone 750 Mg Tabs (Nabumetone) .... Take 1 Tablet By Mouth Two Times A Day 23)  Estrace 2 Mg Tabs (Estradiol) .... Take 1 Tablet By Mouth Once A Day 24)  Nitroglycerin 0.4 Mg Subl (Nitroglycerin) .... One Tablet Under Tongue Every 5 Minutes As Needed For Chest Pain---May Repeat Times Three 25)  Amitriptyline Hcl 100 Mg Tabs (Amitriptyline Hcl) .... Take 1 Tab By Mouth At Bedtime 26)  Amitriptyline Hcl 10 Mg Tabs (Amitriptyline Hcl) .... Take 1 Tablet By Mouth Once A Day At Bedtime 27)  Miralax  Powd (Polyethylene Glycol 3350) .... Take 17gm By Mouth in 8oz of Water or Juice Daily  Allergies (verified): 1)  ! Sulfa  Comments:  Nurse/Medical Assistant: The patient's medication list and allergies were reviewed with the patient and were updated in the Medication and Allergy Lists.  Past History:  Past Medical History: Last updated: 07/17/2009 Fibromyalgia Hypothyroidism Diabetes type 2 CAD - BMS circ/RCA 2001, BMS RCA 2003, PTCA RCA/ramus 2004, DES RCA 2005, DES RCA 2007, DES OM/LAD 2009, DES RCA 7/11, LVEF 65% Hyperlipidemia Hypertension  Social History: Last updated: 01/20/2009 Retired  Married  Tobacco Use - No Alcohol Use - no Regular Exercise - no Drug Use - no  Clinical Review  Panels:  Cardiac Imaging Cardiac Cath Findings ANGIOGRAPHIC FINDINGS:   1. Left main coronary artery had no obstructive disease.   2. The left anterior descending had a long segment of 50% to 60%       stenosis in the proximal segment.  The mid vessel had diffuse 30%       plaque.  The distal vessel had a stent that was patent without any       restenosis.  The first 2 diagonal branches were very small in       caliber.  The third diagonal branch was a small to moderate-sized       vessel with mild plaque disease.   3. Circumflex artery had a proximal 30% stenosis.  The first obtuse       marginal was very small in caliber.  The second obtuse marginal was       moderate-to-large sized and had a patent stent.  The AV groove       circumflex bifurcated into a third obtuse marginal which had a       patent stent.  The AV groove circumflex had a 90% ostial stenosis       just after the takeoff of the third obtuse marginal branch.  This       is most likely because of the stent that was placed across this       branch.  This is unchanged from prior catheterization.   4. The right coronary artery is a large dominant vessel with area of       diffuse stenting throughout the proximal and mid vessel.  The       stents angiographically appeared to overlap.  Beyond the stented       segment in the distal portion of the vessel, there are two serial       80% lesions.  These lesions have progressed in severity since the       last catheterization.  The distal RCA bifurcates into the       posterolateral segment which has mild plaque disease and a small-       caliber posterior descending artery that has a 99% stenosis.  This       vessel is less than 1.0 mm and too small for intervention.  This       vessel is unchanged in appearance since the last catheterization.   5. Left ventricular angiogram was performed in the RAO projection       which showed normal left ventricular systolic function with        ejection fraction of 65%. (07/13/2009)    Review of Systems       The patient complains of dyspnea on exertion.  The patient denies anorexia, fever, weight loss, chest pain, syncope, peripheral edema, prolonged cough, melena, and hematochezia.         Otherwise  reviewed and negative except as outlined.  Vital Signs:  Patient profile:   65 year old female Height:      62 inches Weight:      214 pounds Pulse rate:   65 / minute BP sitting:   153 / 80  (left arm) Cuff size:   large  Vitals Entered By: Carlye Grippe (March 01, 2010 1:05 PM)  Physical Exam  Additional Exam:  GEN: 65 year old female, obese, sitting upright, in no distress HEENT: NCAT,PERRLA,EOMI NECK: palpable pulses, no bruits; no JVD; no TM LUNGS: CTA bilaterally HEART: RRR (S1S2); no significant murmurs; no rubs; no gallops ABD: soft, NT; intact BS EXT: 1+ bilateral lower extremity edema SKIN: warm, dry MUSC: no obvious deformity NEURO: A/O (x3)      EKG  Procedure date:  03/01/2010  Findings:      Normal sinus rhythm at 61 beats per minute with low voltage and nonspecific ST changes.  Impression & Recommendations:  Problem # 1:  CAD, NATIVE VESSEL (ICD-414.01)  Symptomatically stable, continue medical therapy. Refills provided for sublingual nitroglycerin. Followup anticipated in 6 months.  Her updated medication list for this problem includes:    Labetalol Hcl 100 Mg Tabs (Labetalol hcl) .Marland Kitchen... Take 1 tablet by mouth two times a day    Norvasc 10 Mg Tabs (Amlodipine besylate) .Marland Kitchen... Take 1 tablet by mouth once a day    Imdur 60 Mg Xr24h-tab (Isosorbide mononitrate) .Marland Kitchen... Take 1 tablet by mouth once a day    Carvedilol 25 Mg Tabs (Carvedilol) .Marland Kitchen... Take 1 tablet by mouth two times a day    Plavix 75 Mg Tabs (Clopidogrel bisulfate) .Marland Kitchen... Take one tablet by mouth daily    Aspirin Ec 325 Mg Tbec (Aspirin) .Marland Kitchen... Take 1 tablet by mouth once a day    Nitroglycerin 0.4 Mg Subl (Nitroglycerin)  ..... One tablet under tongue every 5 minutes as needed for chest pain---may repeat times three  Problem # 2:  HYPERTENSION, UNSPECIFIED (ICD-401.9)  Blood pressure trend somewhat better. I encouraged regular exercise, sodium restriction, and weight loss.  Her updated medication list for this problem includes:    Labetalol Hcl 100 Mg Tabs (Labetalol hcl) .Marland Kitchen... Take 1 tablet by mouth two times a day    Furosemide 80 Mg Tabs (Furosemide) .Marland Kitchen... Take 1 tablet by mouth once a day    Diovan 320 Mg Tabs (Valsartan) .Marland Kitchen... Take 1 tablet by mouth once a day    Clonidine Hcl 0.3 Mg Tabs (Clonidine hcl) .Marland Kitchen... Take 1 tablet by mouth two times a day    Norvasc 10 Mg Tabs (Amlodipine besylate) .Marland Kitchen... Take 1 tablet by mouth once a day    Carvedilol 25 Mg Tabs (Carvedilol) .Marland Kitchen... Take 1 tablet by mouth two times a day    Aspirin Ec 325 Mg Tbec (Aspirin) .Marland Kitchen... Take 1 tablet by mouth once a day  Her updated medication list for this problem includes:    Labetalol Hcl 100 Mg Tabs (Labetalol hcl) .Marland Kitchen... Take 1 tablet by mouth two times a day    Furosemide 80 Mg Tabs (Furosemide) .Marland Kitchen... Take 1 tablet by mouth once a day    Diovan 320 Mg Tabs (Valsartan) .Marland Kitchen... Take 1 tablet by mouth once a day    Clonidine Hcl 0.3 Mg Tabs (Clonidine hcl) .Marland Kitchen... Take 1 tablet by mouth two times a day    Norvasc 10 Mg Tabs (Amlodipine besylate) .Marland Kitchen... Take 1 tablet by mouth once a day    Carvedilol 25 Mg Tabs (  Carvedilol) .Marland Kitchen... Take 1 tablet by mouth two times a day    Aspirin Ec 325 Mg Tbec (Aspirin) .Marland Kitchen... Take 1 tablet by mouth once a day  Problem # 3:  HYPERLIPIDEMIA-MIXED (ICD-272.4)  Lipid control looks good overall, LDL at goal. No changes made. Plan to followup fasting lipid profile and liver function tests for her next visit.  The following medications were removed from the medication list:    Lovaza 1 Gm Caps (Omega-3-acid ethyl esters) .Marland Kitchen... Take 1 tablet by mouth once a day Her updated medication list for this problem  includes:    Crestor 5 Mg Tabs (Rosuvastatin calcium) .Marland Kitchen... Take one tablet by mouth daily.    Niaspan 500 Mg Cr-tabs (Niacin (antihyperlipidemic)) .Marland Kitchen... Take 1 tablet by mouth once a day  Other Orders: EKG w/ Interpretation (93000)  Patient Instructions: 1)  Your physician wants you to follow-up in: 6 months. You will receive a reminder letter in the mail one-two months in advance. If you don't receive a letter, please call our office to schedule the follow-up appointment. 2)  Your physician recommends that you go to the [facility] for a FASTING lipid profile and liver function labs:  DO IN 6 MONTHS BEFORE NEXT OFFICE VISIT- Prescriptions: NITROGLYCERIN 0.4 MG SUBL (NITROGLYCERIN) One tablet under tongue every 5 minutes as needed for chest pain---may repeat times three  #25 x 3   Entered by:   Cyril Loosen, RN, BSN   Authorized by:   Loreli Slot, MD, [facility]   Signed by:   Cyril Loosen, RN, BSN on 03/01/2010   Method used:   Electronically to        [id number] (retail)       [address]       [place], [place]  [address]       Ph: [phone]       Fax: [phone]   RxID:   [id number]

## 2010-03-15 ENCOUNTER — Encounter: Payer: Self-pay | Admitting: Cardiology

## 2010-03-25 LAB — MRSA PCR SCREENING: MRSA by PCR: NEGATIVE

## 2010-03-25 LAB — CBC
HCT: 37.1 % (ref 36.0–46.0)
Hemoglobin: 12.5 g/dL (ref 12.0–15.0)
MCH: 31.3 pg (ref 26.0–34.0)
MCHC: 33.6 g/dL (ref 30.0–36.0)
MCV: 93 fL (ref 78.0–100.0)
Platelets: 180 10*3/uL (ref 150–400)
RBC: 3.99 MIL/uL (ref 3.87–5.11)
RDW: 14.6 % (ref 11.5–15.5)
WBC: 6.3 10*3/uL (ref 4.0–10.5)

## 2010-03-25 LAB — PLATELET INHIBITION P2Y12
P2Y12 % Inhibition: 60 %
Platelet Function  P2Y12: 139 [PRU] — ABNORMAL LOW (ref 194–418)
Platelet Function Baseline: 344 [PRU] (ref 194–418)

## 2010-03-25 LAB — BASIC METABOLIC PANEL
BUN: 11 mg/dL (ref 6–23)
CO2: 24 mEq/L (ref 19–32)
Calcium: 8.6 mg/dL (ref 8.4–10.5)
Chloride: 101 mEq/L (ref 96–112)
Creatinine, Ser: 1.16 mg/dL (ref 0.4–1.2)
GFR calc Af Amer: 57 mL/min — ABNORMAL LOW (ref >60)
GFR calc non Af Amer: 47 mL/min — ABNORMAL LOW (ref >60)
Glucose, Bld: 135 mg/dL — ABNORMAL HIGH (ref 70–99)
Potassium: 4.1 mEq/L (ref 3.5–5.1)
Sodium: 135 mEq/L (ref 135–145)

## 2010-03-25 LAB — GLUCOSE, CAPILLARY
Glucose-Capillary: 137 mg/dL — ABNORMAL HIGH (ref 70–99)
Glucose-Capillary: 66 mg/dL — ABNORMAL LOW (ref 70–99)
Glucose-Capillary: 72 mg/dL (ref 70–99)
Glucose-Capillary: 76 mg/dL (ref 70–99)

## 2010-03-25 LAB — LIPID PANEL
Cholesterol: 128 mg/dL (ref 0–200)
HDL: 48 mg/dL (ref >39)
LDL Cholesterol: 49 mg/dL (ref 0–99)
Total CHOL/HDL Ratio: 2.7 RATIO
Triglycerides: 154 mg/dL — ABNORMAL HIGH (ref <150)
VLDL: 31 mg/dL (ref 0–40)

## 2010-05-22 NOTE — Assessment & Plan Note (Signed)
[facility]                          [facility] OFFICE NOTE   NAME:HODGESArica, [name]                     MRN:          [id number]  DATE:04/11/2008                            DOB:          1945-05-18    PRIMARY CARE PHYSICIAN:  Dr. [name].   REASON FOR VISIT:  Routine followup.   HISTORY OF PRESENT ILLNESS:  [name] was seen back in December  following a percutaneous coronary intervention in the setting of  unstable angina.  She was noted to have patent stents in the right  coronary artery and circumflex with progressive disease in the left  anterior descending and obtuse marginal, both requiring intervention  with drug-eluting stents.  She continues to do very well without any  significant angina.  She and her husband have started walking more  regularly at the [facility].  She had very favorable looking lipids done back  in February by Dr. Charm Barges with an LDL of 66, total cholesterol of 156,  HDL 57 and triglycerides 166.  She remains on a large medication list,  although generally tolerates things well.  She states that she was seen  by Dr. Kristian Covey to exclude the possibility of renal artery stenosis and  had an overall reassuring workup.  Generally, she states that she has  been feeling good.   ALLERGIES:  SULFA drugs.   PRESENT MEDICATIONS:  1. Potassium 20 mEq p.o. q.a.m.  2. Omega-3 supplements 2000 mg p.o. b.i.d.  3. Labetalol 100 mg p.o. b.i.d.  4. Lantus 42 units subcu nightly.  5. Apidra 12 units p.r.n. blood sugar greater than 200.  6. Glyburide 6 mg p.o. q.a.m.  7. Metformin 1000 mg p.o. q.p.m.  8. Levothyroxine 150 mcg p.o. q.a.m.  9. Torsemide 20 mg p.o. q.a.m.  10.Diovan 320 mg p.o. q.a.m.  11.Clonidine 0.3 mg p.o. q.p.m.  12.Norvasc 10 mg p.o. q.a.m.  13.Imdur 60 mg p.o. q.a.m.  14.Coreg 25 mg p.o. b.i.d.  15.Plavix 75 mg p.o. q.a.m.  16.Crestor 5 mg p.o. nightly.  17.Enteric-coated aspirin 325 mg p.o. q.p.m.  18.Niaspan 500 mg p.o. q.p.m.  19.Nabumetone 750 mg p.o. b.i.d.  20.Estrace 2 mg q.a.m.  21.Sublingual nitroglycerin 0.4 mg p.r.n.   REVIEW OF SYSTEMS:  As outlined above.  She is not having any  claudication or palpitations.  Breathing is stable, NYHA class II.  Shortness of breath with activity.  No orthopnea or PND.  Otherwise,  reviewed and negative.   PHYSICAL EXAMINATION:  VITAL SIGNS:  Blood pressure is 146/90, heart  rate is 61, weight is 216 pounds, and respirations 18.  GENERAL:  This is an overweight woman in no acute distress.  NECK:  No elevated jugular venous pressure.  No loud bruits.  LUNGS:  Clear without labored breathing at rest.  CARDIAC:  Regular rate and rhythm.  No S3 gallop or pericardial rub.  EXTREMITIES:  No pitting edema.   IMPRESSION AND RECOMMENDATIONS:  1. Multivessel cardiovascular disease, status post multiple      percutaneous interventions, most recently with drug-eluting stent      placement to the left anterior descending  and obtuse marginal      system.  She has normal left ventricular systolic function.  At      this point, she remains stable symptomatically and is tolerating a      walking regimen.  We will continue medical therapy and I will see      her back for symptom observation over the next 6 months.  2. Hyperlipidemia, well controlled on present regimen.     [name], MD  Electronically Signed    SGM/MedQ  DD: 04/11/2008  DT: 04/11/2008  Job #: [id number]   cc:   [name]

## 2010-05-22 NOTE — Discharge Summary (Signed)
[name], [name]              ACCOUNT NO.:  [id number]   MEDICAL RECORD NO.:  [id number]          PATIENT TYPE:  INP   LOCATION:  6532                         FACILITY:  [facility]   PHYSICIAN:  Marca Ancona, MD      DATE OF BIRTH:  1945-07-09   DATE OF ADMISSION:  12/11/2007  DATE OF DISCHARGE:  12/12/2007                               DISCHARGE SUMMARY   DISCHARGING DIAGNOSES:  1. Coronary artery disease, status post cardiac catheterization and      percutaneous coronary intervention with Promus drug-eluting stent      to the obtuse marginal and a Taxus drug-eluting stent to the mid      left anterior descending with severe two-vessel coronary artery      disease, previously placed stent patent.  2. Extreme nausea and vomiting, postcardiac catheterization, relieved      with antiemetic.  3. Hypertension.  4. Diabetes.  5. Dyslipidemia.   PAST MEDICAL HISTORY:  Coronary artery disease, status post previous  stents to the left circumflex, bare metal stent, stent to the distal RCA  at the proximal right coronary artery, previous PCI stenting of the  right coronary artery with a bare metal stent, previous PCI, cutting  balloon of in-stent restenosis in the right coronary artery, PCI of the  ramus branch, previous PCI stenting of the proximal right mid coronary  artery with a Cypher drug-eluting stent.   HOSPITAL COURSE:  [name] is a 65 year old Caucasian female with  known history of coronary artery disease, status post previous multiple  cardiac catheterizations and interventions, who presented initially to  [facility] complaining of chest discomfort consistent with  unstable angina.  The patient was transported by [facility] to [facility]  for cardiac catheterization.  The patient with severe two-vessel disease  in the LAD and left circumflex, previously placed stents patent to the  RCA and left circumflex and preserved LV function.  She underwent  successful stenting  to the OM and LAD with drug-eluting stents by Dr.  Tonny Bollman.  The patient tolerated the procedure without  complications, but experienced significant nausea and vomiting following  the catheterization, treated with antiemetics.  On day of discharge, the  patient is stable.  No further nausea or vomiting.  Appetite good.  Ready to go home.  Potassium 4.0, creatinine 1.11, and hematocrit 38.1.  The patient is being discharged home to follow up with Dr. Andee Lineman in 1-2  weeks in [place].  She has been given the postcardiac catheterization  discharge instructions.   DISCHARGE MEDICATION:  Medication on discharge list is quite lengthy.  It includes:  1. Lantus 42 units daily.  2. Apidra 12 units for CBG greater than 200.  3. Glyburide 6 mg daily.  4. Metformin 1000 mg daily, the patient may resume this on the 7th.  5. Levothyroxine 150 mcg daily.  6. Torsemide 20 mg daily.  7. Diovan 320.  8. Clonidine 0.3.  9. Norvasc 10.  10.Imdur 60.  11.Coreg 25 b.i.d.  12.Plavix 75.  13.Crestor 5.  14.Aspirin 325.  15.Niaspan 500.  16.Amitriptyline 100.  17.Estrace 2  mg daily.  18.Potassium 20 mEq daily.  19.Lovaza 4000 mg daily.  20.Nitroglycerin as needed.  21.Nabumetone 750 mg b.i.d.  22.Multivitamins as previously taken.  23.Fish oil 2000 mg daily if not taking the Lovaza.   Of note, the patient's total cholesterol 154, triglycerides 155, HDL 56,  and LDL 67.  The patient is to follow up with Dr. Andee Lineman for further  management outpatient, consider increasing the patient's Crestor to 10.   DURATION OF DISCHARGE ENCOUNTER:  Greater than 30 minutes.      Dorian Pod, ACNP      Marca Ancona, MD  Electronically Signed    MB/MEDQ  D:  12/12/2007  T:  12/12/2007  Job:  [id number]   cc:   [name]

## 2010-05-22 NOTE — Assessment & Plan Note (Signed)
[facility]                          [facility] OFFICE NOTE   NAME:HODGESLorella, [name]                     MRN:          [id number]  DATE:04/09/2007                            DOB:          1945-11-24    PRIMARY CARDIOLOGIST:  Dr. [name].   REASON FOR VISIT:  Scheduled 20-month follow-up.  Please refer to [name] office note of December 08, 2006, for full details.   At that time, the patient had adjustment of her medication regimen with  substitution of Lasix with hydrochlorothiazide.  This has since been  increased to 50 mg a day, for more aggressive blood pressure control.   She also was referred for a 2-D echocardiogram for further evaluation of  a cardiac murmur.  The echo, however, showed no significant valvular  abnormalities, with normal left ventricular function (EF greater than  65%).   Clinically, the patient reports no recurrent angina pectoris since she  presented with acute coronary syndrome in February 2008, treated with  Taxus stenting of the distal circumflex.   CURRENT MEDICATIONS:  1. Coreg 25 mg b.i.d.  2. Hydrochlorothiazide 50 mg daily.  3. Diovan 320 mg daily.  4. Norvasc 10 mg daily.  5. Imdur 60 mg daily.  6. Plavix.  7. Full dose aspirin.  8. Potassium chloride 20 mEq daily.  9. Glynase 6 mg daily.  10.Glucophage XR 1000 mg daily.  11.Lantus 42 units daily.  12.Clonidine 0.3 mg b.i.d.  13.Lovaza 4 gm daily.  14.Fish oil 2400 q.a.m./1200 q.p.m.  15.Simcor 500/20 mg nightly.  16.Estrace.  17.Apidra 10 units daily.  18.Synthroid 0.15 mg daily.  19.Amitriptyline 100 mg nightly.  20.Nabumetone.   PHYSICAL EXAMINATION:  VITAL SIGNS:  Blood pressure 153/86, pulse 58,  regular.  Weight 217.  GENERAL:  A 65 year old female, morbidly obese, sitting upright in no  distress.  HEENT:  Normocephalic, atraumatic.  NECK:  Palpable bilateral carotid pulses without bruits; no JVD at 90  degrees.  LUNGS:  Clear to auscultation in all fields.  HEART:  Regular rate and rhythm (S1-S2).  No significant murmurs.  No  rubs.  ABDOMEN:  Protuberant, nontender with intact bowel sounds.  EXTREMITIES:  Intact distal pulses with no significant edema.  NEURO:  No focal deficit.   IMPRESSION:  1. Coronary artery disease.      a.     Status post multivessel percutaneous intervention with       numerous prior stents, most recently Taxus stenting of 100% distal       CFX, following presentation with NSTEMI, February 2008.      b.     Residual nonobstructive CAD with patent RCA stent; new 100%       occluded ramus intermedius branch.  2. Uncontrolled hypertension.      a.     Requiring multimodal medication regimen.      b.     Normal renal ultrasound with no evidence of renal artery       stenosis, April 2008.  3. Preserved left ventricular function.  4. Mixed dyslipidemia.  5. Insulin-dependent diabetes mellitus.  6. Morbid obesity   PLAN:  1. Renew prescription for p.r.n. nitroglycerin.  2. Down titrate aspirin to 162 mg daily, then 81 mg indefinitely.  The      patient is to remain on Plavix indefinitely, given her history of      numerous stents and repeat history of in-stent restenosis.  3. Aggressive lipid management, per Dr. Charm Barges, with target LDL of 70      or less.  4. Surveillance blood work with a complete metabolic profile.  It does      not appear that the patient has had follow-up blood work, after up      titration of her hydrochlorothiazide.  5. The patient strongly encouraged to initiate a regular walking      regimen, to help her both with      weight reduction as well as better blood pressure control.  6. Schedule return clinic follow-up with myself and Dr. [name] in 4      months.     [name], PA-C  Electronically Signed      [name], MD  Electronically Signed   GS/MedQ  DD: 04/09/2007  DT: 04/09/2007  Job #: [id number]   cc:   [name]

## 2010-05-22 NOTE — Cardiovascular Report (Signed)
[name]              ACCOUNT NO.:  [id number]   MEDICAL RECORD NO.:  [id number]          PATIENT TYPE:  OIB   LOCATION:  2899                         FACILITY:  [facility]   PHYSICIAN:  Veverly Fells. Excell Seltzer, MD  DATE OF BIRTH:  17-Apr-1945   DATE OF PROCEDURE:  12/11/2007  DATE OF DISCHARGE:                            CARDIAC CATHETERIZATION   PROCEDURES:  1. Left heart catheterization.  2. Selective coronary angiography.  3. Left ventricular angiography.  4. Percutaneous transluminal coronary angioplasty and stenting of the      obtuse marginal 1 and left anterior descending branches.   INDICATIONS:  [name] is a 65 year old woman with diabetes and  multiple cardiac risk factors.  She has known coronary artery disease  and has had multiple percutaneous interventions.  Her last study was  back in 2008 when she underwent stenting of severe stenosis of the left  circumflex.  She has also had multiple stents in the right coronary  artery.  She presented with typical symptoms of unstable angina and was  referred for cardiac cath.   DESCRIPTION OF PROCEDURE:  Risks and indications of procedure were  reviewed with the patient.  Informed consent was obtained.  The right  groin was prepped, draped, and anesthetized with 1% lidocaine.  Using  the modified Seldinger technique, a 5-French sheath was placed in the  right femoral artery.  Standard 5-French Judkins catheters were used for  coronary angiography and left ventriculography.  All catheter sheaths  were performed over a guidewire.  The patient tolerated the diagnostic  procedure well.   Following the diagnostic procedure, I elected to perform PCI on severe  lesions in the OM-1 branch of the left circumflex as well as the mid  LAD.  Stents in the right coronary artery and left circumflex were  widely patent.  There was a severe de novo stenosis in the first OM  branch of at least 95%.  There was also a 95-99% stenosis in  the mid LAD  involving a long segment of that vessel.  Angiomax was used for  anticoagulation.  The patient was already on maintenance Plavix.  She  was given an additional 300 mg of Plavix.  Angiomax was used for  anticoagulation.  Once the therapeutic ACT was achieved, a 6-French XB  3.5 cm guide catheter was inserted.  A Cougar wire was advanced easily  into the OM branch beyond the area of severe stenosis.  The lesion was  predilated with a 2.0 x 12 mm Apex balloon which was taken to 8  atmospheres.  Following predilatation, there was improvement in flow.  There actually was a long segment of stenosis beyond the area of  critical stenosis.  I elected to stent the entire segment with a 2.5 x  23 mm PROMUS stent.  The stent was carefully positioned and deployed at  12 atmospheres.  Following stenting, there was a residual waist in the  mid portion of the stent.  I elected to post-dilate with a 2.5 x 15 mm  Quantum-Maverick balloon which was positioned over the area of residual  stenosis  and dilated to 14 atmospheres.  There was excellent stent  expansion with post-dilatation.  At the completion of the stent  procedure, there was TIMI 3 flow in the vessel with a well-expanded  stent.  At that point, attention was turned to the LAD.  The same Cougar  guidewire was pulled back and advanced into the apical portion of the  LAD across the area of severe stenosis.  The same 2.0 x 12 mm Apex stent  was carefully positioned and inflated to 6 atmospheres.  The balloon was  removed and angiography demonstrated TIMI 3 flow in the vessel.  There  was also a long segment of stenosis and a small portion of the mid and  distal LAD.  I elected to use a 2.25 x 24 mm Taxus Adam stent which was  carefully positioned at the origin of the second diagonal branch and it  covered the entire area of disease.  The stent was deployed at 12  atmospheres.  I used the same 2.5 x 15 mm Quantum-Maverick balloon to   post-dilate.  Care was taken to make sure that the balloon remained  inside of the stent as it was slightly oversized.  A total of 3  inflations were performed to cover the entire stented segment.  The  stent appeared well expanded with a good transition zone at the  completion of the procedure.  There was TIMI 3 flow in the vessel.  There was an excellent angiographic result.  The patient tolerated the  procedure well.   Of note, the patient did have profound nausea and vomiting during the  procedure.  I suspected this was related to her sedative medications  that were given.  There was no other evidence of an allergic reaction  and there was no evidence of ongoing ischemia as there was excellent  flow in all vessels and she did not have much chest pain.   FINDINGS:  Aortic pressure 185/88 with a mean of 127.  Left ventricular  pressure 189/25.   Left mainstem:  There is minor nonobstructive plaque.  The left main  bifurcates into the LAD and left circumflex.   LAD:  LAD is a large-caliber vessel.  Proximally, there is diffuse  nonobstructive stenosis.  There is 50% stenosis at the origin of the  first septal perforator.  There is diffuse 50% stenosis in the  intervening segment of the LAD between the first and second perforators.  The LAD then transitions into a normal-appearing vessel until the second  diagonal branch where there is an area of critical stenosis following  the second diagonal.  This is a 95-99% stenosis.  Beyond that in the  distal LAD, there is a tandem lesion of approximately 50%.  The LAD goes  down to wrap around the apex and there is nonobstructive disease  throughout.  The diagonal branches have no significant stenosis.   Left circumflex:  The proximal left circumflex has mild nonobstructive  stenosis.  It supplies the first OM branch that has an eccentric 95%  stenosis in the proximal portion of the branch of the vessel.  The AV  groove of the left  circumflex is widely patent with a long segment of  stent that has no significant restenosis.  There is then a  posterolateral branch which is widely patent.  The AV groove circumflex  is very small, but there is an ostial 70% AV groove circumflex lesion  that is likely a result of previous stenting into  the other branch  vessel.   Right coronary artery:  The RCA has a long segment of stent.  There are  dense areas of the stented segment that probably are overlapping stents.  There is no significant restenosis in any portion of the vessel.  There  is mild diffuse restenosis noted.  In the distal vessel, there is an  area of 60% eccentric stenosis that is unchanged from the previous  study.  The vessel then branches into a PDA branch and posterior AV  segment.  The PDA has severe stenosis of 90-95%, but it is a very small  vessel, likely less than 1 mm.   Left ventriculography demonstrates normal LV function.  There is no  significant mitral regurgitation.  The LVEF is estimated at 65%.   ASSESSMENT:  1. Severe two-vessel coronary artery disease involving the left      anterior descending and left circumflex.  2. Patent stents in the right coronary artery and atrioventricular      groove circumflex.  3. Preserved left ventricular systolic function.  4. Successful stenting as described above of the first obtuse marginal      branch and the mid left anterior descending with drug-eluting      stents.   PLAN:  Ms. Manahan should remain on lifelong dual antiplatelet therapy as  she has had multiple PCI procedures.  At a minimum, she should be on  aspirin and Plavix in conjunction for the next 12 months.      Veverly Fells. Excell Seltzer, MD  Electronically Signed     MDC/MEDQ  D:  12/11/2007  T:  12/12/2007  Job:  [id number]   cc:   Learta Codding, MD,FACC

## 2010-05-22 NOTE — Assessment & Plan Note (Signed)
[facility]                          [facility]ICE NOTE   JALEI, SHIBLEY                     MRN:          [id number]  DATE:12/08/2006                            DOB:          01-23-1945    CARDIOLOGIST:  [name], M.D.   PRIMARY CARE PHYSICIAN:  Dr. [name].   REASON FOR VISIT:  Six-month followup.   HISTORY OF PRESENT ILLNESS:  Ms. Vinluan is a very pleasant 65 year old  female patient, followed by Dr. [name], with a history of coronary  artery disease status post multiple percutaneous coronary interventions  in the past as well as labile hypertension who returns to the office  today for followup.  She was last seen by Dr. [name] on May 14, 2006.  At that time he discontinued her Toprol and increased her dose of  Carvedilol.  The patient returns today and notes that she is doing well.  She denies any chest pain or shortness of breath.  She denies any  significant dyspnea on exertion.  She denies orthopnea, PND, or pedal  edema, denies any palpitations, syncope, or near syncope.  She notes  that her blood pressure has been running 130s over 70s at home.  She was  recently seen at Dr. Silvana Newness office and her blood pressure was 120s  over 70s.  Her lipids are managed by Dr. Charm Barges and she had a recent  lipid panel that revealed total cholesterol 181, triglycerides 215, HDL  51, and LDL 87.   MEDICATIONS:  1. Glucophage XR 500 mg, two tablets daily.  2. Synthroid 150 mcg daily.  3. Lasix 40 mg daily.  4. Diovan 320 mg daily.  5. Norvasc 10 mg daily.  6. Imdur 60 mg daily.  7. Plavix 75 mg daily.  8. Amitriptyline 100 mg q.h.s.  9. Nabumetone 750 mg b.i.d.  10.Potassium chloride 20 mEq      daily.  11.Aspirin 325 mg daily.  12.Vitamin E.  13.Vitamin C.  14.Lantus insulin 42 units q.h.s.  15.Apidra 5 units as directed.  16.Clonidine 0.3 mg b.i.d.  17.Lovaza 1 gram, four tablets daily.  18.Fish oil.  19.Simcor 500/20 mg q.h.s.  20.Estrace 25 mg daily.  21.Carvedilol 25 mg b.i.d.   ALLERGIES:  SULFA.   PHYSICAL EXAMINATION:  GENERAL:  She is a well nourished well developed  female in no acute distress.  VITAL SIGNS:  Blood pressure is 158/89, pulse 54, repeat blood pressure  by me by manual cuff 190/94 in the right, 190/90 on the left.  HEENT:  Normal.  NECK:  Without  JVD.  COR:  Normal S1 S2.  Regular rate and rhythm with a soft 1-2 over 6  systolic ejection murmur heard best at the right upper sternal border.  LUNGS:  Clear to auscultation bilaterally without wheezing, rhonchi, or  rales.  ABDOMEN:  Soft, nontender with normoactive bowel sounds.  No  organomegaly.  EXTREMITIES:  Without edema.  NEUROLOGIC:  She is alert and oriented x3.  Cranial nerves II-XII are  grossly intact.   Electrocardiogram revealed a sinus bradycardia with a heart rate of 54,  normal axis, T wave inversions in I, II, aVF, and V6.  No significant  changes from previous tracing dated April 11, 2005.   IMPRESSION:  1. Uncontrolled hypertension.  2. Coronary artery disease.      a.     Status post multiple percutaneous coronary interventions in       the past with the most recent being a TAXUS drug eluting stent to       the distal circumflex, February 2008.  3. Preserved left ventricular function.  4. Diabetes mellitus.  5. Hyperlipidemia, followed by Dr. Charm Barges.  6. Morbid obesity.  7. Murmur.   PLAN:  The patient presents to the office today for followup.  Her blood  pressure is actually significantly elevated.  She says she attains  better numbers at home with her own monitor.  She is not having any  symptoms of ischemia.   At this point in time, I plan to:  1. Discontinue her Lasix and initiate HCTZ 25 mg a day.  2. Check a B-MET in 7-10 days along with a blood pressure check with      the nurse.  We will recheck her blood pressure machine at that time      with our monitors.  She has  been asked to keep a blood pressure      diary at home.  3. She will continue followup with Dr. Charm Barges for her lipids.  Her      goal LDL is less than 70.  4. We will check a 2D echocardiogram to follow up on her LV function      and cardiac structure      and valvular status given her notable murmur on exam.  5. She will follow up with Dr. [name] in the next 3 months or sooner      p.r.n.      Tereso Newcomer, PA-C  Electronically Signed      [name], MD  Electronically Signed   SW/MedQ  DD: 12/08/2006  DT: 12/08/2006  Job #: [id number]   cc:   [name]

## 2010-05-22 NOTE — Assessment & Plan Note (Signed)
[facility]                          [facility]ICE NOTE   NAME:[name], [name]                     MRN:          [id number]  DATE:09/28/2007                            DOB:          Sep 25, 1945    PRIMARY CARE PHYSICIAN:  Dr. [name]   REASON FOR VISIT:  50-month followup.   HISTORY OF PRESENT ILLNESS:  [name] is a 65 year old female patient  with a history of coronary artery disease status post multiple  percutaneous coronary interventions in the past as well as labile  hypertension who returns to the office today for routine followup.  She  notes today that she is doing quite well.  She denies chest pain,  shortness of breath, syncope, near-syncope, orthopnea, PND, or pedal  edema.  She is doing well with all of her medications.  She continues to  follow up with Dr. Charm Barges for her lipids.  She had a recent blood work  done in August 2009 that revealed total cholesterol of 191, LDL 78, HDL  47, and triglycerides 282.  She notes that her blood pressures usually  are quite well at home when she checks them.  Her blood pressure cuff  has been calibrated with Dr. Charm Barges.  She notes that most of her  systolic readings are below 140.   CURRENT MEDICATIONS:  Simvastatin 20 mg daily, vitamin D 50,000 units  every 2 weeks, vitamin E, vitamin C, Lantus insulin as directed,  clonidine 0.3 mg b.i.d., Lovaza 1 g 4 tablets daily, fish oil 1200 mg 2  in the morning and 1 in the evening, Simcor 500/20 mg nightly, Estrace 2  mg daily, carvedilol 25 mg b.i.d., hydrochlorothiazide 50 mg daily,  Apidra 10 units daily, Vyvanse 60 mg daily, Glucophage XR 500 mg 2  tablets daily, Synthroid 150 mcg daily, Diovan 320 mg daily, Norvasc 10  mg daily, Imdur 60 mg daily, Plavix 75 mg daily, amitriptyline 100 mg  nightly, nabumetone 750 mg b.i.d., potassium 20 mEq daily, and aspirin  325 mg daily.   PHYSICAL EXAMINATION:  GENERAL:  She is a well-nourished,  well-developed  female.  VITAL SIGNS:  Blood pressure is 164/89, pulse 56, and weight 215 pounds.  HEENT:  Normal.  NECK:  Without JVD.  CARDIAC:  Normal S1 and S2.  Regular rate and rhythm.  LUNGS:  Clear to auscultation bilaterally.  ABDOMEN:  Soft and nontender.  EXTREMITIES:  Without edema.  NEUROLOGIC:  She is alert and oriented x3.  Cranial nerves II through  XII are grossly intact.  VASCULAR:  Without carotid bruits bilaterally.   ASSESSMENT/PLAN:  1. Coronary artery disease status post multiple percutaneous coronary      interventions in the past with most recent being a Taxus drug-      eluting stent to the distal circumflex in February 2008 with a      history of overall preserved left ventricular function.  The      patient is overall doing well.  She denies any anginal symptoms.      She will continue on her current medical regimen as outlined  above.  2. Hypertension.  This is uncontrolled.  However, the patient tells me      that she checks her blood pressures at home and they are usually      fairly well controlled.  I have asked her to continue to take a      blood pressure diary and send [place] the copy of her readings over the      next several weeks.  If her pressures remain above 130/80 which is      her goal with diabetes, then we can likely go up on her Imdur to      achieve better blood pressure control.  We can always consider      hydralazine in this patient as well.  3. Diabetes mellitus.  She will continue to follow up with Dr. Charm Barges.  4. Dyslipidemia.  This is followed by Dr. Charm Barges.  Her recent lipid      panel looks well except for her triglycerides.  She is on multiple      medications to control this.  I have recommended diet therapy to      her today.  I have recommended the [employer].  I have also      asked her to continue to increase her activity.   DISPOSITION:  The patient will be brought back to follow up with Dr.  [name] in the next 6  months or sooner p.r.n.      Tereso Newcomer, PA-C  Electronically Signed      [name], MD  Electronically Signed   SW/MedQ  DD: 09/28/2007  DT: 09/29/2007  Job #: [id number]   cc:   [name]

## 2010-05-22 NOTE — Assessment & Plan Note (Signed)
[facility]                          [facility] OFFICE NOTE   NAME:[name], Gaber                     MRN:          [id number]  DATE:12/21/2007                            DOB:          Nov 11, 1945    PRIMARY CARE PHYSICIAN:  [name].   REASON FOR VISIT:  Post catheterization followup.   HISTORY OF PRESENT ILLNESS:  Ms. Milian was seen in the office back in  September and was doing well at that time symptomatically.  She  presented to [facility] with unstable angina in early  December and was seen by Dr. Andee Lineman.  He transferred her to [facility] where she underwent a cardiac catheterization by Dr. Excell Seltzer.  This study demonstrated stent patency within the right coronary artery  and circumflex; however, progressive disease involving the left anterior  descending and obtuse marginal, both of which were intervened upon using  drug-eluting stents.  She tolerated this procedure well and states that  symptomatically she feels much better.  Her followup electrocardiogram  today shows sinus bradycardia at 57 beats per minute with motion  artifact, nonspecific ST-T wave changes, overall low voltage as noted  previously.  I reviewed her medications.  We talked about a basic  walking regimen and also plan for followup lipids down the road.  Actually, her numbers look reasonable based on recent hospital stay with  LDL cholesterol of 67, HDL of 56, triglycerides of 155, and total  cholesterol of 154.   ALLERGIES:  SULFA DRUGS.   PRESENT MEDICATIONS:  1. Lantus 42 units subcu nightly.  2. Apidra 12 units p.r.n. blood sugar greater than 200.  3. Glyburide 6 mg p.o. q.a.m.  4. Metformin 1000 mg p.o. q.p.m.  5. Levothyroxine 150 mcg p.o. daily.  6. Torsemide 20 mg p.o. daily.  7. Diovan 320 mg p.o. daily.  8. Clonidine 0.3 mg p.o. q.p.m.  9. Norvasc 10 mg p.o. q.a.m.  10.Imdur 60 mg p.o. q.a.m.  11.Coreg 25 mg p.o.  b.i.d.  12.Plavix 75 mg p.o. q.a.m.  13.Crestor 5 mg p.o. nightly.  14.Enteric coded aspirin 325 mg p.o. q.p.m.  15.Niaspan 500 mg p.o. q.p.m.  16.Nabumetone 750 mg p.o. b.i.d.  17.Estrace 2 mg daily.  18.Sublingual nitroglycerin 0.4 mg p.r.n.  19.Potassium 20 mg p.o. q.a.m.  20.Lovaza 4 g daily as well as generic fish oil supplements 2 g b.i.d.  21.Vitamin supplements.   REVIEW OF SYSTEMS:  As outlined above.  No leg or groin complaints  status post cardiac catheterization, otherwise negative.   PHYSICAL EXAMINATION:  VITAL SIGNS:  Blood pressure is 143/78, heart  rate is 52, weight is 218 pounds, which is up from 215 at her last  visit.  GENERAL:  This is an obese woman in no acute distress.  NECK:  No elevated jugular venous pressure, no loud bruits, no  thyromegaly.  LUNGS:  Clear without labored breathing at rest.  CARDIAC:  Regular rate and rhythm.  No pericardial rubs or S3 gallop.  EXTREMITIES:  No significant pitting edema.   IMPRESSION AND RECOMMENDATIONS:  1. Multivessel coronary artery disease status  post multiple      percutaneous interventions with drug-eluting stents, now placed in      all 3 coronary vessels, most recently involving the left anterior      descending and obtuse marginal following presentation with unstable      angina in early December.  Symptomatically, Ms. Muriel is back to      baseline at this point and her left ventricular systolic function      was normal in angiography at 65%.  We will plan to continue medical      therapy.  I have recommended a walking regimen as well.  She will      be seen back clinically over the next 3 months.  2. Hyperlipidemia:  Reasonably well-controlled on present regimen.      Ms. Day will be discontinuing Lovaza and continue generic omega-      3 supplements at 4 g daily as she has tolerated this well and is      concerned about the expense of Lovaza.  She will have follow up      lipids over the next 12  weeks.  3. Hypertension:  On multimodal therapy.     [name], MD  Electronically Signed    SGM/MedQ  DD: 12/21/2007  DT: 12/22/2007  Job #: [id number]   cc:   [name]

## 2010-05-25 NOTE — Cardiovascular Report (Signed)
[name], [name]              ACCOUNT NO.:  [id number]   MEDICAL RECORD NO.:  [id number]          PATIENT TYPE:  INP   LOCATION:  4731                         FACILITY:  [facility]   PHYSICIAN:  Carole Binning, M.D. LHCDATE OF BIRTH:  March 04, 1945   DATE OF PROCEDURE:  10/14/2003  DATE OF DISCHARGE:                              CARDIAC CATHETERIZATION   PROCEDURE:  1.  Left heart catheterization with coronary angiography and left      ventriculography.  2.  Intravascular ultrasound of the right coronary artery.  3.  Percutaneous transluminal coronary angioplasty with placement of a drug-      eluding stent in the proximal and mid right coronary artery.   INDICATIONS FOR PROCEDURE:  The patient is a 65 year old woman with diabetes  and history of coronary artery disease.  She has had multiple prior coronary  interventions.  One year ago, she underwent cutting balloon angioplasty for  in-stent restenosis in the right coronary artery.  She presented to the  office with symptoms of progressive exertional angina.  Attempts were made  to proceed with stress nuclear studies.  An Adenosine Cardiolite was aborted  because of the development of 2 to 1 heart block with Adenosine infusion.  Likewise a dobutamine Cardiolite was also aborted because of the development  of 2 to 1 heart block with dobutamine infusion with associated chest pain  and ST segment depression on EKG.  Based on her response to pharmacologic  stress and progression of symptoms, she was referred for cardiac  catheterization.   DESCRIPTION OF PROCEDURE:  A 6 French sheath was placed in the right  coronary artery.  Coronary angiography was performed using standard Judkins  6 French catheters.  Left ventriculography was performed with an angled  pigtail catheter.  Contrast was Omnipaque.  There were no complications.   RESULTS:  Hemodynamics; left ventricular pressure 182/22, aortic pressure  182/92.  There is no aortic  valve gradient.   Left ventriculogram; wall motion is normal, ejection fraction is estimated  at greater than 55%.  There is no mitral regurgitation.   Coronary arteriography (right dominant).  1.  Left main is normal.  2.  Left anterior descending artery has a tubular 50% stenosis in the      proximal vessel.  The mid vessel has a 20% and the distal vessel has a      20% stenosis.  The LAD gives rise to two small diagonal branches.  3.  The left circumflex had a 40% stenosis proximally.  There is a stent in      the mid vessel which is widely patent with 0% stenosis within the stent.      Beyond the stent in the distal vessel, there is a tubular 30% stenosis.      The circumflex gives rise to a small ramus intermedius which is 100%      occluded in the proximal to midportion.  This vessel was actually      treated with PTCA one year ago.  The circumflex ostial gives rise to a      small first  marginal, normal size second marginal, and a normal size      bifurcating third obtuse marginal branch.  There is a 40% stenosis in      the proximal portion of the second obtuse marginal branch.  4.  The right coronary artery is a dominant vessel.  There are two      overlapping stents in the proximal to mid vessel.  In the proximal      vessel, there appears to be an area of 75% stenosis within the stent.      Just beyond the stent in the mid vessel, there is a 75% stenosis.  In      the distal vessel, there is another stent which is widely patent with      less than 20% stenosis within the stent.  The distal right coronary      artery gives rise to a normal size posterior descending artery which has      a 50% stenosis in the midportion.  There is also a small posterolateral      branch.   IMPRESSION:  1.  Normal left ventricular systolic function.  2.  Two vessel coronary artery disease characterized by moderate, but      nonobstructive disease in the left anterior descending artery.  There  is      100% occlusion of a small ramus intermedius.  There is nonobstructive      disease in the left circumflex.  There is significant area of in-stent      stenosis in the proximal right coronary artery as well as an area of      stenosis just beyond the stent.   PLAN:  These images were reviewed with Arturo Morton. Riley Kill, M.D. [facility], who  performed her last intervention.  It is our feeling that the area of disease  in the right coronary artery does represent a significant area of stenosis.  We therefore decided to proceed with further evaluation of intravascular  ultrasound followed by percutaneous coronary intervention if indicated.   PROCEDURE:  PTCA.   DESCRIPTION OF PROCEDURE:  We utilized preexisting 6 French sheath in the  right femoral artery.  Angiomax was administered per protocol.  Using a 6  [place] JR4 guiding catheter, a Asahi soft coronary guide wire was advanced  under fluoroscopic guidance into the distal right coronary artery.  We then  performed intravascular ultrasound with the Galaxy II intravascular  ultrasound system.  This did reveal that the tightest area of stenosis  within the stent was significant in that the IVUS catheter was stuffed  within the area of restenosis.  There was also a significant area of  narrowing in the vessel just beyond the stent in the mid vessel.  The distal  reference vessel was 2.5 mm diameter.  We therefore proceeded with stent  placement.  We positioned a 2.5 x 28 mm Cypher drug-eluding stent across the  area of restenosis within the stent in the proximal vessel.  The stent  extended beyond the previously placed stents to cover the 75% stenosis  beyond the margin of the stent.  We deployed this stent at a deployment  pressure of 14 atmospheres.  We then went back in with a 2.75 x 20 mm  Quantum balloon.  This balloon was inflated to 20 atmospheres in the proximal portion of the stent and 12 atmospheres in the mid to distal  portion of  the stent.  Intermittent doses of intracoronary nitroglycerin  were administered.  Final angiographic images were obtained revealing  patency of the right coronary artery with 0% residual stenosis at the stent  site and TIMI 3 flow into the distal vessel.   COMPLICATIONS:  None.   RESULTS:  Successful placement of a drug-eluding stent in the proximal and  mid right coronary artery.  A 75% in-stent restenosis followed by a 75% de  novo restenosis beyond the stent were both reduced to 0% residual and TIMI 3  flow.   PLAN:  Angiomax will be discontinued.  The patient will be treated with  Plavix for recommended one year.  She needs continued risk factor  modification.  I would recommend medical therapy for her residual coronary  artery disease.       MWP/MEDQ  D:  10/14/2003  T:  10/14/2003  Job:  [id number]   cc:   Leo [address]  [place]  [place] [address]  Fax: [phone]

## 2010-05-25 NOTE — Cardiovascular Report (Signed)
[name], [name]              ACCOUNT NO.:  [id number]   MEDICAL RECORD NO.:  [id number]          PATIENT TYPE:  INP   LOCATION:  4730                         FACILITY:  [facility]   PHYSICIAN:  Charlies Constable, M.D. [facility] DATE OF BIRTH:  31-Mar-1945   DATE OF PROCEDURE:  03/26/2005  DATE OF DISCHARGE:                              CARDIAC CATHETERIZATION   PROCEDURE PERFORMED:  Percutaneous coronary intervention.   ATTENDING:  Charlies Constable, M.D.   CLINICAL HISTORY:  [name] is 65 years old and has documented coronary  disease with multiple percutaneous interventions.  I have previously done  stent procedures on the circumflex and distal right coronary artery.  She  had remote bare-metal stent in the right coronary artery followed by cutting  balloon angioplasty for restenosis and her last intervention was a long  CYPHER stent for recurrent restenosis, which was performed by Dr. Gerri Spore  in October of 2005.  She was admitted this time with unstable angina and  studied immediately prior to this by Dr. [name] and found to have a tight  restenosis right at the distal edge of the previously placed CYPHER stent.  She also had a 40% lesion in the distal vessel, which we elected not to  treat.   DESCRIPTION OF PROCEDURE:  The procedure was performed via the right femoral  artery using arterial sheath and 6-French JR4 guiding catheter with  sideholes.  The patient was given Angiomax bolus and infusion.  This  initially did not result in a therapeutic ACT and we thought there was a  kink in the line and we opened the kink and reestablished the flush and we  were able to get an ACT of 203.  However, we checked another ACT after  another half-bolus near the end of the procedure and it was less than 150;  for this reason, we elected to put her on a drip at the end of the procedure  for another 2 hours.   We passed a Prowater wire across the lesion in the mid-vessel without  difficulty.  We  dilated with a 2.25 x 50-mm Maverick balloon, performing 1  inflation up to 14 atmospheres for 30 seconds before we got full expansion.  We then deployed a 2.5 x 60-mm TAXUS stent, deploying this with 1 inflation  of 9 atmospheres for 30 seconds.  We post-dilated with a 2.5 x 12-mm Quantum  Maverick, performing 2 inflations up to 16 atmospheres for 30 seconds.  The  final diagnostic study was then performed through the guiding catheter.  The  patient tolerated the procedure well and left the laboratory in satisfactory  condition.   RESULTS:  Initially, the stenosis was estimated at 90% and was located  distal to the distal edge of the CYPHER sent.  Following stenting, this  improved to 0%.   CONCLUSION:  Successful percutaneous coronary intervention of the lesion in  the mid right coronary artery (at the edge of the stent) with improvement in  the percentage in the area of narrowing from 90% to 0% using a TAXUS drug-  eluting stent.   DISPOSITION:  The patient was returned to the post-angioplasty unit for  further observation.           ______________________________  Charlies Constable, M.D. [facility]     BB/MEDQ  D:  03/26/2005  T:  03/28/2005  Job:  [id number]   cc:   Abner Greenspan MD   [name], M.D. [facility]  [address]  [place]  [place] [address]   [facility]   [facility] Cardiopulmonary Lab

## 2010-05-25 NOTE — Cardiovascular Report (Signed)
[name], [name]              ACCOUNT NO.:  [id number]   MEDICAL RECORD NO.:  [id number]          PATIENT TYPE:  INP   LOCATION:  6526                         FACILITY:  [facility]   PHYSICIAN:  Arturo Morton. Riley Kill, MD, FACCDATE OF BIRTH:  1945/12/06   DATE OF PROCEDURE:  02/28/2006  DATE OF DISCHARGE:                            CARDIAC CATHETERIZATION   INDICATIONS:  This nice 65 year old lady has insulin-dependent diabetes,  hypertension and hyperlipidemia.  She has had multiple stent procedures  including the right circumflex artery.  She presented with an acute  coronary syndrome with prolonged chest pain and was treated in the  emergency room at [facility] and referred to [facility] cath lab  for urgent catheterization.  Risks, benefits were discussed in the  catheterization laboratory with the patient.   PROCEDURE:  1. Left heart catheterization  2. Selective coronary arteriography.  3. Selective left ventriculography.  4. Drug-eluting stent implantation in the circumflex coronary artery.   DESCRIPTION OF PROCEDURE:  The patient was brought to the  catheterization laboratory, prepped and draped in the usual fashion.  Through an anterior puncture, the right femoral artery was easily  entered.  A 6-French sheath was placed.  Following this, views of the  right and then left coronary arteries were obtained in multiple  angiographic projections.  Central aortic and left ventricular pressures  were measured with a pigtail.  Ventriculography was performed in the RAO  projection.  We then discussed the case with the patient's family and  the patient.  She had a new high-grade lesion distal to the previously  placed stent in the circumflex coronary artery.  The right coronary  artery stents were widely patent, although there is a 60% lesion  distally.  The patient was agreeable to proceed with percutaneous  intervention and preparations were then made.  A JL-4 guiding  catheter  was utilized.  Bivalirudin was given according to protocol and ACT  checked and found to be in excess of 300 seconds.  A Prowater wire after  adequate anticoagulation was passed down the circumflex coronary artery  in the distal vessel.  Predilatation was achieved with 2.25 x 12  Maverick balloon.  Following this, the vessel was stented using a 2.75 x  16 Taxus drug-eluting stent placing the stent just distally pass the an  AV branch, laying over an AV branch.  The stent also telescoped into the  previously placed stent more proximally.  The stent was deployed at  about 12 atmospheres.  We elected not to go higher because of the distal  vessel size.  Following this, we chose a post dilatation balloon with a  3.0 x 12 Quantum Maverick.  This was placed in the distal vessel, then  pulled back and two inflations up to 14 atmospheres were performed along  the proximal and midportion of the stent.  There was no significant side  branch compromise and there was an excellent angiographic appearance.  This was subsequently removed.  The femoral sheath was sewn into place.  Because of some nausea, the patient was given intravenous Zofran.  She  also  received several doses of intravenous metoprolol for hypertension.   Oral clopidogrel 300 mg was given in the catheterization laboratory as  the patient was on chronic oral clopidogrel.   HEMODYNAMIC DATA:  1. Central aortic pressure 169/84, mean 121.  2. Left ventricular pressure 182/23.  3. No gradient on pullback across the aortic valve.   ANGIOGRAPHIC DATA:  1. The right coronary artery is extensively stented throughout the      midvessel.  There is no evidence of significant restenosis.  There      is some diffuse luminal irregularity.  There is a lesion distally      that is about 60%.  This was present at the time of the last      stenting procedure.  There was about 50% area of stenosis in the      PDA.  2. The left main is free  of critical disease.  3. The LAD courses to the apex.  There is about 40% narrowing in the      vessel just after the septal.  After a major diagonal, there is      some segmental plaquing of 50% in tandem fashion.  The distal LAD      wraps the apex.  4. The ramus intermedius vessel is a small-caliber vessel that has      been previously dilated with a balloon only and is totally occluded      its midportion.  5. The AV circumflex provides a moderate-sized marginal branch with      about 40% segmental plaquing.  There is a stent placed in the      midvessel and just past this location is a progressive lesion from      the previous study.  On the previous study, there was about 40%      segmental plaquing and now this is subtotally occluded at 95%.      Following balloon and stenting, this was reduced to 0% with good      distal runoff in the distal vessel which demonstrates some mild      luminal irregularities.   Ventriculography in the RAO projection reveals vigorous global systolic  function without segmental wall motion abnormality.   IMPRESSION:  1. New high-grade lesion of the distal circumflex treated with TAXUS      drug-eluting platform.  2. Recurrent coronary artery disease.  3. Diabetes.  4. Hypertension.   PLAN:  The patient will be treated medically.  She will see our  cardiology team in [place] in follow-up.  She will need close monitoring  and perhaps ischemic evaluation of the right coronary artery when she is  stabilized.      Arturo Morton. Riley Kill, MD, [facility]  Electronically Signed     TDS/MEDQ  D:  02/28/2006  T:  03/01/2006  Job:  [id number]   cc:   Learta Codding, MD,FACC  Jillyn Hidden Much, M.D.  CV Laboratory  Patient's medical record  [name]

## 2010-05-25 NOTE — Cardiovascular Report (Signed)
[place]  Patient:    [name], [name] Visit Number: [id number] MRN: [id number]          Service Type: CAT Location: 6500 [id number] Attending Physician:  [name] Dictated by:   [name], M.D. [facility] Proc. Date: 01/29/01 Admit Date:  01/29/2001   CC:         [name], M.D.   Cardiac Catheterization  DATE OF BIRTH:  1945-11-13.  CARDIOLOGIST:  [name], M.D.  PRIMARY CARE PHYSICIAN:  [name], M.D.  INDICATIONS:  [name] is a 65 year old woman with a history of hypertension, type 2 diabetes mellitus, and known coronary artery disease status post stent placement to the mid circumflex as well as the proximal and distal right coronary artery in December of 2001.  The patient presents with progressive exertional left shoulder and arm discomfort similar to prior presentation and is referred for cardiac catheterization to define the coronary anatomy.  PROCEDURES PERFORMED: 1. Selective coronary angiography. 2. Left heart catheterization. 3. Left ventriculography.  ACCESS AND EQUIPMENT:  The area about the right femoral artery was anesthetized with 1% lidocaine and a #6 French sheath was placed in the right femoral artery via the modified Seldinger technique.  Standard preformed #6 [place] and JR4 catheters were used for selective coronary angiography.  A #6 French angled pigtail catheter was used for left ventriculography and left heart catheterization.  All exchanges were made over a wire.  The patient tolerated the procedure well without complications.  HEMODYNAMICS:  Left ventricle 180/30 mmHg (post angiography).  Aorta 180/90 mmHg.  ANGIOGRAPHIC FINDINGS: 1. The left main coronary artery is free of significant coronary    atherosclerosis. 2. The left anterior descending is a medium caliber vessel that provides    essentially one bifurcating diagonal branch.  There is approximately  40%    proximal LAD stenosis, 30% mid LAD stenosis, and 40% more diffuse distal    LAD stenosis. 3. There is a small ramus intermedius branch that is diffusely diseased    between 80-95%. 4. The circumflex coronary artery is a medium caliber vessel that provides two    large obtuse marginal branches.  There is a 30-40% proximal circumflex    stenosis, a 30% first obtuse marginal stenosis, and a 20% distal circumflex    stenosis involving a distal aspect of the stent site.  This stent is    grossly patent with TIMI 3 flow distally. 5. The right coronary artery is a small caliber vessel that is dominant and    provides the posterior descending branch.  The proximal and distal stent    sites are grossly patent.  There is 20-30% stenosis in the proximal vessel    involving the distal aspect of the proximal stent.  This diffuse disease    culminates in a 75% focal stenosis at the mid RCA level.  There is also    10-20% stenosis in the distal RCA that involves the distal stent site.  Left ventriculography reveals a left ventricular ejection fraction estimated at 65-70% without focal wall motion abnormalities.  No significant mitral regurgitation is noted.  DIAGNOSES: 1. Obstructive coronary artery disease involving the mid right coronary artery    and a small diffusely diseased ramus intermedius branch. 2. Patent stent sites in the mid circumflex, proximal right coronary artery,    and distal right coronary artery with minor in-stent restenosis as outlined    above. 3. Normal left  ventricular contractions with an ejection fraction estimated at    65-70% and no focal wall motion abnormalities. 5. No significant mitral regurgitation noted.  RECOMMENDATIONS:  Discussed the films with Dr. Juanda Chance.  At this point will plan percutaneous coronary intervention to address the mid RCA stenosis. Obviously the small diffusely diseased ramus intermedius branch will need to be managed medically.    Dictated by:   [name], M.D. [facility] Attending Physician:  [name] DD:  01/29/01 TD:  01/29/01 Job: [id number]

## 2010-05-25 NOTE — Discharge Summary (Signed)
[name], [name]              ACCOUNT NO.:  [id number]   MEDICAL RECORD NO.:  [id number]          PATIENT TYPE:  INP   LOCATION:  6523                         FACILITY:  [facility]   PHYSICIAN:  [name], M.D. LHCDATE OF BIRTH:  03/04/1945   DATE OF ADMISSION:  03/26/2005  DATE OF DISCHARGE:  03/27/2005                                 DISCHARGE SUMMARY   Primary care physician is [name], M.D., primary cardiologist is  [name], M.D.   PRINCIPAL DIAGNOSIS:  Unstable angina/coronary artery disease.   OTHER DIAGNOSES:  1.  Hypertension.  2.  Hyperlipidemia.  3.  Type 2 diabetes mellitus.  4.  Morbid obesity.  5.  Fibromyalgia.  6.  Hypothyroidism.  7.  Status post cholecystectomy.  8.  Status post bladder tack.  9.  Status post hysterectomy.  10. History of carpal tunnel syndrome, status post bilateral release.   ALLERGIES:  SULFA.   PROCEDURES:  Left heart cardiac catheterization, percutaneous coronary  intervention and stenting of the mid-right coronary artery with a 2.5 x 16  mm TAXUS drug-eluting stent.   HISTORY OF PRESENT ILLNESS:  A 65 year old white female with history of CAD  and multiple procedures to the right coronary artery, the last occurring in  October 2005, at which time a Cypher drug-eluting stent was placed  proximally.  She was in her usual state of health until March 15, when she  began to experience intermittent rest and exertional 7/10 left arm throbbing  and chest pressure associated with shortness of breath lasting 15-30 minutes  and relieved with rest, similar to previous angina.  Secondary to  persistence of symptoms, she called our office on March 20 and was advised  to present to the [facility] ED, where she was treated with heparin and IV  nitroglycerin and arrangements were made for transfer to [facility].   HOSPITAL COURSE:  The patient's cardiac markers were negative and she was  pain-free on arrival.  On March 20  she underwent left heart cardiac  catheterization, which revealed a new 80% stenosis of the mid-right coronary  artery with otherwise patent stents in the proximal RCA, distal RCA and mid-  left circumflex.  She otherwise had nonobstructive coronary disease.  EF was  65% without any regional wall motion abnormalities.  Dr. Juanda Chance reviewed her  films and she underwent successful PCI and stenting of the mid-right  coronary artery with a 2.5 x 16 mm TAXUS drug-eluting stent.  She tolerated  this procedure well and this morning has been ambulating without recurrent  chest discomfort or limitations.  She is being discharged home today in  satisfactory condition.   DISCHARGE LABORATORY DATA:  Hemoglobin 11.8, hematocrit 34.5, WBC 5.9,  platelets 235.  Sodium 140, potassium 3.3, chloride 104, CO2 27, BUN 9,  creatinine 1.1, glucose 188, total bilirubin 0.7, alkaline phosphatase 31,  AST 18, ALT 14, albumin 3.6.  CK 44, MB 1.1, troponin I 0.07.  Calcium 8.1.  TSH is currently pending.   DISPOSITION:  The patient is being discharged home today in good condition.   FOLLOW-UP PLANS  AND APPOINTMENTS:  She has a follow-up appointment with Dr.  Nona Dell at the [facility] clinic in [place] on April 5 at 12:30  p.m.   DISCHARGE MEDICATIONS:  1.  Plavix 75 mg daily.  2.  Enteric-coated aspirin 325 mg daily.  3.  Lantus 42 units q.h.s.  4.  Glucophage 1000 mg daily to be reinitiated March 29, 2005.  5.  Glynase 6 mg daily.  6.  Lasix 40 mg daily.  7.  K-Dur 20 mEq daily.  8.  Synthroid 150 mcg daily.  9.  Lipitor 40 mg daily.  10. Diovan 320 mg daily.  11. Toprol XL 100 mg b.i.d.  12. Clonidine 0.1 mg b.i.d.  13. Norvasc 10 mg daily.  14. Amitriptyline 100 mg q.h.s.  15. Fish oil 1700 mg b.i.d.  16. Estrace 1 mg daily.  17. Relafen 75 mg b.i.d.  18. Apidra 5 units p.r.n.  19. Nitroglycerin 0.4 mg sublingual p.r.n. chest pain.   OUTSTANDING LAB STUDIES:  TSH is pending.    DURATION OF DISCHARGE ENCOUNTER:  40 minutes including physician time.      Ok Anis, NP    ______________________________  [name], M.D. [facility]    CRB/MEDQ  D:  03/27/2005  T:  03/28/2005  Job:  [id number]   cc:   [name]  Fax: [phone]

## 2010-05-25 NOTE — Cardiovascular Report (Signed)
NAME:  [name], [name]                        ACCOUNT NO.:  [id number]   MEDICAL RECORD NO.:  [id number]                   PATIENT TYPE:  INP   LOCATION:  3731                                 FACILITY:  [facility]   PHYSICIAN:  Arturo Morton. Riley Kill, M.D.             DATE OF BIRTH:  1945/12/31   DATE OF PROCEDURE:  05/13/2002  DATE OF DISCHARGE:                              CARDIAC CATHETERIZATION   INDICATIONS FOR PROCEDURE:  The patient is a 65 year old diabetic white  female who has severe hypertension as well as difficult to control insulin  requiring diabetes. She has had prior stenting of both the circumflex and  right coronary arteries. She presented with prolonged chest pain and enzymes  were negative. Electrocardiographic abnormalities were absent. She was  transferred for further evaluation to include diagnostic cardiac  catheterization.   PROCEDURE:  1. Left heart catheterization.  2. Selective coronary arteriography.  3. Selective left ventriculography.  4. Distal aortography to rule out renal artery stenosis.   DESCRIPTION OF PROCEDURE:  The procedure was performed from the right  femoral artery using 6 French catheters. We were able to enter the artery  with a first front wall stick and a 6 French sheath was placed. The patient  had severe hypertension during the course of the procedure and it was  required to give Labetalol on at least 3 separate occasions.   Pressures were measured in the left ventricle and aorta and there was no  gradient on pullback across the aortic valve. Distal aortography was  performed to rule out renal artery stenosis. She tolerated the procedure  well. The patient probably requires 2-vessel intervention, but even despite  the blood pressure medications, her systolic pressure rose to 240 in the  laboratory and therefore we elected to defer any type of intervention at the  present time.   HEMODYNAMIC DATA:  1. Central aorta, 208/98, mean  145.  2. Left ventricle 211/29.  3. No aortic left ventricular gradient on pullback across the aortic valve.   ANGIOGRAPHIC DATA:  1. Ventriculography was performed in the RAO projection. The ejection     fraction was calculated at 70%. No wall motion abnormalities were noted.     There was no significant mitral regurgitation noted as well.  2. The central aorta demonstrates no renal artery stenosis. The aorta itself     is fairly smooth. No high grade areas of disease were identified either     involving the distal aorta or the aortic bifurcation and iliacs.  3. The left main coronary artery was free of critical disease.  4. The LAD coursed to the apex. There is a long area of segmental plaquing     of about 30% to at most 50% in the mid vessel after the septal     perforator. This looked slightly worse in an RAO view then in the LAO     views. Distally after  the 3rd diagonal branch there is a 50% area of     focal eccentric narrowing noted in the distal artery. The very distal     vessel was without critical narrowing. There was a small caliber 1st     diagonal branch which shows progression of disease from the previous     study. There is 50%, 60% and then 90 plus percent stenosis in a vessel     that appears to be between 1.75 and 2-mm in size. The distal vessel was     moderate in distribution.  5. The circumflex has a tiny 1st marginal branch then 20% to 30% narrowing.     The 2nd marginal branch is moderate in size and has probably 50% to at     most 60% smooth narrowing in a fairly large caliber vessel. It does not     appear to be flow limiting. The stent is widely patent and there is about     30% narrowing prior to the bifurcation distally.  6. The right coronary artery is a dominant vessel with a posterior     descending and posterolateral branch. There are 2 stents overlapping in     the mid vessel. In the center of the stented area there is about a 70%     area of focal  hypodensity that likely represents partial in-stent     restenosis. There is 80% narrowing in a small to moderate right     ventricular branch as well.   CONCLUSION:  1. Severe uncontrolled hypertension.  2. Well preserved overall systolic left ventricular function.  3. No evidence of significant renal artery stenosis.  4. Partial restenosis in the right coronary artery at the previous site of     stenting.  5. Progression of disease in the 1st diagonal branch.   DISPOSITION:  We took the patient off the table. She had had a moderate dye  load in the intermediate or diagonal intervention and may require some  additional contrast. Moreover, the patient's blood pressure even after 60 mg  of Labetalol was 240 systolic, making use of antithrombin drugs difficult at  this moment. We elected to take the patient off the table, work at getting  her blood pressure down, and try to improve on this before bringing her back  to the laboratory for a percutaneous intervention. We will likely redilate  the right stent and dilate the intermediate vessel which will likely not  allow stenting. I  have reviewed the films with her husband and son. We plan  to do this on Monday.                                               Arturo Morton. Riley Kill, M.D.    TDS/MEDQ  D:  05/14/2002  T:  05/16/2002  Job:  [id number]   cc:   [address]  [place]  [place] [address]  Fax: [phone]   [name], M.D. [facility]

## 2010-05-25 NOTE — Cardiovascular Report (Signed)
NAMEJAMINA, MACBETH              ACCOUNT NO.:  [id number]   MEDICAL RECORD NO.:  [id number]          PATIENT TYPE:  INP   LOCATION:  4730                         FACILITY:  [facility]   PHYSICIAN:  [name], M.D. LHCDATE OF BIRTH:  [birth year]   DATE OF PROCEDURE:  03/26/2005  DATE OF DISCHARGE:                              CARDIAC CATHETERIZATION   INDICATIONS:  Ms. Mclucas is a 65 year old woman with a history of  hypertension, hyperlipidemia, type 2 diabetes mellitus, and coronary artery  disease status post multiple prior coronary interventions. She underwent  bare metal stent placement to the circumflex, proximal and distal right  coronary arteries back in 2001 and has had subsequent balloon angioplasties  predominately involving the right coronary artery as well as a small ramus  intermedius branch. She most recently, in 2005, underwent placement of two  Cypher drug-eluting stents to the proximal and mid right coronary artery.  She presents now with symptoms concerning for unstable angina and is  referred for diagnostic coronary angiography to clearly assess coronary  anatomy and evaluate for revascularized strategies. The risks and benefits  were explained in advance and informed consent was obtained.   PROCEDURE PERFORMED:  1.  Left heart catheterization  2.  Selective coronary angiography.  3.  Left ventriculography.   ACCESS AND EQUIPMENT:  The area about the right femoral artery was  anesthetized 1% lidocaine. A 6 French sheath was placed in the right femoral  artery via the modified Seldinger technique. Standard preformed 6 [place]  and JR4 catheters were used for selective coronary angiography and an angled  pigtail catheter was used for left heart catheterization and left  ventriculography. All exchanges were made over a wire. A total 120 mL  Omnipaque were used. The patient tolerated procedure well without any  complications.   HEMODYNAMIC RESULTS:  Aorta  is 170/92 mmHg. Left ventricle 170/14 mmHg.   ANGIOGRAPHIC FINDINGS:  1.  The left main coronary artery is free of significant flow limiting      coronary atherosclerosis. This vessel gives rise to left anterior      descending and circumflex coronary arteries as well as a small      subtotally occluded ramus intermedius.  2.  The left anterior descending is a medium caliber vessel with mid level      diagonal branch that bifurcates. The left anterior descending proper is      diffusely diseased at 50% in the proximal segment, 50% in the mid      segment, and a more focal area of 50% in the distal segment. No clearly      flow limiting stenoses are noted.  3.  The circumflex coronary artery is a relatively large vessel with three      obtuse marginal branches. There is 20% stenosis in the proximal segment      of the vessel. There is a stent within the mid circumflex that is widely      patent followed by approximately 40% stenosis in the distal vessel. The      second obtuse marginal is fairly large and  bifurcating and has 30%      stenosis in its proximal segment.  4.  The right coronary artery is a small to medium caliber vessel that is      dominant. Stent sites are visualized within the proximal to mid and very      distal portions of the vessel. The proximal stents are widely patent      followed by an area of 80% focal stenosis. There is then approximately      60% stenosis just proximal to the most distal stent prior to the      posterior descending branch.  5.  Left ventriculography was performed in the RAO projection and reveals      ejection fraction of approximately 65% with no significant anterior or      inferior wall motion abnormalities and no significant mitral      regurgitation.   DIAGNOSIS:  1.  Multivessel coronary disease as outlined, predominately moderate in      severity. Stent sites within the circumflex proximal to mid and very      distal right coronary  arteries are widely patent. There remains fairly      diffuse 50% stenosis in the left anterior descending, 40% stenosis in      the distal circumflex, and now a focal 80% stenosis just distal to the      mid right coronary artery stents, and a 60% stenosis just proximal to      the most distal right coronary artery stent.  2.  Left ventricular ejection fraction approximately 65% with no focal wall      motion abnormalities and left ventricular end diastolic pressure of 14      mmHg.   DISCUSSION:  I reviewed the results with the patient and with Dr. Juanda Chance.  The plan at this point will be to proceed with percutaneous intervention to  the right coronary artery and otherwise optimize medical therapy.           ______________________________  [name], M.D. [facility]     SGM/MEDQ  D:  03/26/2005  T:  03/27/2005  Job:  [id number]   cc:   [name], M.D.

## 2010-05-25 NOTE — Letter (Signed)
March 02, 2006    [name], MD  [phone] [address]  [place], [place] [address]   RE:  [name], [name]  MRN:  [id number]  /  DOB:  1945/01/24   Sam:   Thanks for your help this weekend.  [name] was discharged on  Sunday.  I was impressed at the degree of recalcitrant hypertension, and  had 2 thoughts that might be worth exploring.  The first is the possible  contribution of sleep apnea.  She does snore.  She does have some  daytime somnolence, although on further questioning, the family was less  impressed than they were at the beginning.  The second option is renal  vascular contribution, and whether renal vascular Doppler ultrasound  would be of value.   Hope you have a good day.    Sincerely,      Duke Salvia, MD, [facility]  Electronically Signed    SCK/MedQ  DD: 03/02/2006  DT: 03/02/2006  Job #: [id number]

## 2010-05-25 NOTE — Discharge Summary (Signed)
[name], [name]                        ACCOUNT NO.:  [id number]   MEDICAL RECORD NO.:  [id number]                   PATIENT TYPE:  INP   LOCATION:  6533                                 FACILITY:  [facility]   PHYSICIAN:  Thomas C. Wall, M.D.                DATE OF BIRTH:  Nov 24, 1945   DATE OF ADMISSION:  05/13/2002  DATE OF DISCHARGE:  05/18/2002                           DISCHARGE SUMMARY - REFERRING   DISCHARGE DIAGNOSES:  1. Coronary artery disease.     a. Catheterization performed by Arturo Morton. Riley Kill, M.D. on 05/13/02;        partial end stent restenosis of the RCA stent; progression of coronary        artery disease of a small first diagonal; progression of disease in        the circumflex-intermediate vessel.     b. Status post PTCA with a cutting balloon of the RCA on 05/17/02 by        Maisie Fus D. Riley Kill, M.D.     c. Status post PTCA of the circumflex-intermediate vessel with a Mavik        balloon by Arturo Morton. Riley Kill, M.D. on 05/17/02.  2. Labile hypertension.     a. Distal aortogram and catheterization on 05/13/02 without evidence of        renal artery stenosis.  3. Dyslipidemia (beginning triglycerides 4/09, HDL 38).  4. Diabetes mellitus type 2.  5. Obesity.  6. Ejection fraction 70%.   PROCEDURES PERFORMED:  1. Cardiac catheterization by Arturo Morton. Riley Kill, M.D. on 05/13/02.  2. Percutaneous coronary intervention by Arturo Morton. Riley Kill, M.D. on 05/17/02.   HOSPITAL COURSE:  Please see the dictated note by [name], M.D.  [facility] at [facility] on 05/13/02 for complete details.  Briefly,  this 65 year old female was seen on 05/13/02 at [facility]  for recurrent chest pain.  It was felt that her symptoms were consistent  with angina pectoris and she was transferred to [facility] for  further evaluation to include cardiac catheterization.  The procedure was  performed by Dr. Riley Kill on 05/13/02.  The results are noted above.   Please  see the dictated note for complete details.  Because the patient received a  dye load and her blood pressure was extremely high at 240 systolic, the  patient was taken off the table.  It was elected to proceed with better  blood pressure control and to bring the patient back to the lab on Monday,  5/10 for intervention.  The patient remained stable throughout the weekend.  She did have recurrence of chest pain and heparin was restarted on 05/16/02.  Plavix was also added.  Blood pressure continued to be high.  Norvasc 10 mg  was added to her medical regimen.  Toprol was increased to 150 mg a day.  Her triglycerides were also noted to be extremely elevated on admission  and  Lipitor was increased to 40 mg q.h.s.  The patient's Tiazac and Hytrin were  both held at [facility].  These were not restarted at [facility].  At discharge, her blood pressure was still somewhat elevated but more stable  at 159/79.  Dr. Riley Kill saw the patient on 5/11 and felt she was ready for  discharge to home.  She will need close followup at our office in [place].  Consideration could be given toward testing the patient further for her  hypertension.  This could include 24 hour urine for catecholamine, U&A and  metanephrine's.  This could be initiated at her return office visit as an  outpatient if felt to be necessary.   LABORATORY DATA:  White count 4800, hemoglobin 12.3, hematocrit 36.6,  platelet count 236,000.  INR on admission 0.9. Sodium 136, potassium 3.3  (treated prior to discharge).  Chloride 102, CO2 28, glucose 199, BUN 9,  creatinine 0.8, calcium 8.3.  Cardiac enzymes negative x1 at [facility].  Triglycerides 409, total cholesterol 139, HDL 38, LDL not calculated  secondary to elevated triglyceride level.  Chest x-ray on 5/6, no active  disease.  No pending tests at the time of this dictation.   DISCHARGE MEDICATIONS:  1. Plavix 75 mg daily.  2. Lasix 40 mg daily.  3. Imdur 60 mg  daily.  4. Diovan 320 mg daily.  5. Lantus insulin.  6. Elavil 100 mg q.h.s.  7. Clinase 6 mg daily.  8. Glucophage XR 500 mg t.i.d. to be restarted on Thursday, 5/13.  9. Naprosyn p.r.n.  10.      Toprol XL 100 mg.  11.      Lipitor 40 mg q.h.s.  12.      Catapres TTS-3 q week.  13.      Norvasc 10 mg daily.  14.      Aspirin 325 mg daily.  15.      Nitroglycerin p.r.n. chest pain.  16.      Synthroid 0.125 mg daily (or as taken prior to admission).   PAIN MANAGEMENT:  Nitroglycerin p.r.n. chest pain.   DISCHARGE INSTRUCTIONS:  The patient is to call our office in [place] or 911  with recurrent chest pain.   ACTIVITY:  No driving, no heavy lifting, exertional work or sex for one  week.  She is to gradually increase as tolerated.   DIET:  Low salt, low fat diabetic diet.   WOUND CARE:  The patient is to call our office in [place] for any swelling,  bleeding or bruising.   SPECIAL INSTRUCTIONS:  The patient has been advised to stop taking her  Tiazac and terazosin (Hytrin) for now.    FOLLOW UP:  The patient will see the physician assistant for Dr. [name] on  06/01/02 at 1 p.m. in [place].  The patient is to followup with her primary care  physician, Dr. Charm Barges in one to two weeks.  She is to call for an  appointment.      Tereso Newcomer, P.A.                        Thomas C. Wall, M.D.    SW/MEDQ  D:  05/18/2002  T:  05/19/2002  Job:  [id number]   cc:   [address]  [place]  [place] [address]  Fax: [phone]   [name], M.D. [facility]   Maisie Fus D. Riley Kill, M.D.

## 2010-05-25 NOTE — Discharge Summary (Signed)
[name], [name]              ACCOUNT NO.:  [id number]   MEDICAL RECORD NO.:  [id number]          PATIENT TYPE:  INP   LOCATION:  4736                         FACILITY:  [facility]   PHYSICIAN:  [name], MD DATE OF BIRTH:  1945-07-31   DATE OF ADMISSION:  02/28/2006  DATE OF DISCHARGE:  03/02/2006                               DISCHARGE SUMMARY   PATIENT'S PRIMARY CARDIOLOGIST:  Dr. Simona Huh and Dr. Michelle Piper Degent.   PRIMARY CARE PHYSICIAN:  Dr. Florian Buff.   PROCEDURES PERFORMED DURING HOSPITALIZATION:  1. Cardiac catheterization dated February 28, 2006 per Dr. Shawnie Pons.      a.     Right coronary artery extensively stented throughout the mid       vessels.  There is no evidence of significant restenosis.  There       is some diffuse luminal irregularity.  There is a lesion distally       that is about 50%.  This was present at the time of the last       stenting procedure.  There is about a 50% area of stenosis in the       PDA.  The left main is free of critical disease.  The LAD courses       to the apex.  There is about 40% narrowing in the vessel just       after the septal.  At the major diagonal there is some segmental       plaquing of 50% in a tandem fashion. The distal LAD wraps around       the apex.  The ramus intermedius vessel is a small caliber vessel       that has been previously dilated with balloon only and is totally       occluded in its mid portion.  The AV circumflex provides moderate       size marginal branch with about 40% segmental plaquing.  There is       a stent placed in the mid vessel just past this location and is a       progressive lesion from the previous study.  On the previous study       there is about 40% segmental plaquing and is now subtotally       occluded at 95%.  Ventriculography in the RAO projection reveals       vigorous levels of systolic function without any segmental wall       motion abnormality.  2. Status  post PCI and stenting with a drug-eluting stent Taxus to the      circumflex artery reduced to 0% with good distal run off and the      distal vessel was demonstrating mild luminal irregularity.   PRIMARY DIAGNOSES:  1. Unstable angina.  2. Multivessel coronary artery disease, status post percutaneous      coronary intervention to right coronary artery and status post VES      to distal circumflex on February 28, 2006 by Dr. Riley Kill.   SECONDARY DIAGNOSIS:  1. Diabetes  mellitus.  2. Hypertension.  3. Hyperlipidemia.  4. Morbid obesity.   HISTORY OF PRESENT ILLNESS:  This is a 65 year old, morbidly obese,  Caucasian female who has a history of coronary artery disease and was  last seen by Dr. [name] in October of 2007, who presented to the  emergency room in [place] at [facility] with substernal chest  discomfort.  Dr. Andee Lineman saw her during the evaluation and found it to be  necessary to transfer her to [facility].  The patient states that the  symptoms had been ongoing since her last stenting.  However, she had  some increased tightness from walking upstairs, relieved with rest, not  requiring treatment.  She developed substernal chest pressure, 10/10,  with associated diaphoresis, nausea and recurrent vomiting with  radiation to the left shoulder around 6 a.m. the day of transfer.  Her  EKG showed more pronounced ST depression in the inferior and lateral  leads.  However, the initial set of cardiac enzymes were negative.  The  patient was also found to be hypertensive on admission with a blood  pressure of 204/105 with a pulse of 70.  The patient was transferred to  [facility] for cardiac catheterization and intervention if  necessary.  The patient was started on heparin prior to transfer along  with hydralazine for blood pressure control.   HOSPITAL COURSE:  The patient was seen and examined by Dr. Shawnie Pons on arrival to [facility] and subsequent  cardiac  catheterization was completed.  As above, the patient had a high-grade  lesion in the distal circumflex which was treated with a Taxus drug-  eluting stent.  The patient tolerated the procedure well without  evidence of arrhythmia or recurrent stenosis or chest pain.  The patient  was seen by Cardiac Rehab at [facility] and is being referred  to Cardiac Rehab to [facility].  During hospitalization the  patient continued to have evidence of hypertension and her medications  were adjusted per Dr. [name], increasing her clonidine to 0.3 mg  b.i.d. from 0.2 mg b.i.d.  The patient continued to do well throughout  hospitalization and was seen and examined on day of discharge and found  to be stable.  The patient is to follow up with Dr. [name] in the [facility] in approximately 2 weeks.  She has been giving a prescription for  her increased dose of Clonidine.  She is also to follow up with Cardiac  Rehab in [facility] as well.   LABS ON DISCHARGE:  Sodium 141, potassium 3.9, chloride 103, CO2 29, BUN  14, creatinine 1, glucose 164, hemoglobin 11.6, hematocrit 34.5, white  blood cell 6.7, platelets 219.  Blood pressure 155/82, heart rate 90,  respirations 20, temperature 97.4.   FOLLOWUP PLAN AND APPOINTMENTS:  1. The patient is scheduled to be seen by Dr. Simona Huh in the      office in [place].  Since this is the weekend, the patient will be      called by the office to schedule an appointment.  2. The patient has been advised on increased dose of Clonidine to 0.3      mg twice a day from her earlier dose of 0.2 mg twice a day.  3. The patient has been given post cardiac catheterization and stent      instructions with particular references to the right groin for      evidence of bleeding, infection  or swelling.   DISCHARGE MEDICATIONS:  1. Aspirin 325 mg once a day.  2. Plavix 75 mg daily.  3. Simvastatin 40 mg daily. 4. Potassium chloride 20 mEq  daily.  5. Estradiol 1 mg daily.  6. Nabumetone 750 mg twice a day.  7. Lantus insulin 42 units at bedtime.  8. Glyburide 6 mg daily.  9. Metformin XR 500 mg with p.m. meals.  10.Levothyroxine 150 mg daily.  11.Metoprolol 100 mg daily.  12.Diovan 320 mg daily.  13.Amlodipine 10 mg daily.  14.Isosorbide mono 60 mg daily.  15.Lasix 40 mg daily.  16.Clonidine 0.3 mg b.i.d. (new prescription given).  17.Amitriptyline 100 mg p.o. q.h.s. p.r.n.  18.Relafen 750 mg b.i.d.  19.Fish oil 100 mg one a.m., two p.m.  20.Lovaza 1 gram p.o. daily.  21.Vitamin E 400 units daily.  22.Vitamin C 500 units daily.  23.Aspirin enteric coated 81 mg daily.   ALLERGIES:  NO KNOWN DRUG ALLERGIES.   Time spent with the patient to include physician time, 30 minutes.      Bettey Mare. Lyman Bishop, NP      [name], MD  Electronically Signed    KML/MEDQ  D:  03/02/2006  T:  03/02/2006  Job:  [id number]

## 2010-05-25 NOTE — Discharge Summary (Signed)
NAMEANNISTYN, [name]              ACCOUNT NO.:  [id number]   MEDICAL RECORD NO.:  [id number]          PATIENT TYPE:  INP   LOCATION:  6526                         FACILITY:  [facility]   PHYSICIAN:  [name], M.D. LHCDATE OF BIRTH:  18-Dec-1945   DATE OF ADMISSION:  10/13/2003  DATE OF DISCHARGE:  10/14/2003                                 DISCHARGE SUMMARY   PROCEDURES:  1.  Cardiac catheterization.  2.  Coronary arteriogram.  3.  Left ventriculogram.  4.  Percutaneous transluminal coronary angioplasty and CYPHER stent to one      vessel .   HOSPITAL COURSE:  [name] is a 65 year old female with a history of  coronary artery disease as well as diabetes, dyslipidemia and hypertension.  She had some chest pain and a Cardiolite was scheduled.  However during the  Cardiolite she developed 2to1 heart block associated with chest pain and the  Cardiolite was aborted.  This occurred both with adenosine and dobutamine  Cardiolite study.  Dr. [name] evaluated [name] and felt that she  needed transfer to [facility] for further evaluation and  catheterization.   [name] did well overnight and was taken to the cath lab on October 14, 2003.  The cardiac catheterization showed greater than 75% in-stent  restenosis in the RCA.  This was assessed with intravascular ultrasound as  well as with angiography.  Dr. Gerri Spore performed percutaneous intervention  with Angiomax and CYPHER stent reducing the stenosis to 0 with TIMI-3 flow.  She is to continue Plavix for a full year.   Post cath [name] is resting comfortably.  If she remains stable  overnight, she will be discharged on the morning of October 15, 2003 with  outpatient followup arranged.   DISCHARGE DIAGNOSES:  1.  Unstable anginal pain, status post percutaneous transluminal coronary      angioplasty with stent to the right coronary artery for in-stent      restenosis.  2.  Two to one heart block during  stress testing felt secondary to ischemia.  3.  Diabetes.  4.  Dyslipidemia.  5.  Hypertension.  6.  Status post percutaneous transluminal coronary angioplasty and stent to      the proximal and distal right coronary artery as well as to the      circumflex.  7.  Preserved left ventricular function with an ejection fraction of greater      than 55% and no mitral regurgitation by catheterization this admission.  8.  ALLERGY TO SULFA DRUGS.  9.  Status post gallbladder surgery, bladder tack, hysterectomy, carpal      tunnel to both arms.  10. History of hypothyroidism.  11. Fibromyalgia.   DISCHARGE INSTRUCTIONS:  Her activity level is to include no driving or for  two days and no strenuous activity for a week.  She is to stick to a low fat  diabetic diet.  She is to call the office with problems with the cath site.  She is to follow up with Dr. [name] on October 28, 2003 at 11:00 a.m. and  with Dr. Charm Barges as needed.   DISCHARGE MEDICATIONS:  1.  Glucophage XR 500 mg 2 tabs p.o. t.i.d., restart on Monday.  2.  Plavix 75 mg daily for a year.  3.  Nitroglycerin sublingual p.r.n.  4.  Lantus 39 units subcu q.h.s.  5.  Guanase 6 mg p.o. daily.  6.  Synthroid 125 mcg p.o. daily.  7.  Lasix 40 mg p.o. daily.  8.  Toprol XL 100 mg 1-1/2 tabs p.o. daily.  9.  Catapres patch q. week.  10. Estrace 2 mg 1/2 tab p.o. daily.  11. Lipitor 40 mg p.o. daily.  12. Amitriptyline 100 mg p.o. q.h.s.  13. Naprelan 50 mg p.o. daily.  14. Diovan 320 mg p.o. daily.  15. Enteric-coated aspirin 325 mg p.o. daily.  16. IMDUR 60 mg p.o. daily.  17. Norvasc 10 mg p.o. daily.       RB/MEDQ  D:  10/14/2003  T:  10/14/2003  Job:  [id number]   cc:   [phone]  [place]  [place] [address]  Fax: [phone]

## 2010-05-25 NOTE — Cardiovascular Report (Signed)
NAMELEYLAH, TARNOW                        ACCOUNT NO.:  [id number]   MEDICAL RECORD NO.:  [id number]                   PATIENT TYPE:  INP   LOCATION:  6533                                 FACILITY:  [facility]   PHYSICIAN:  Arturo Morton. Riley Kill, M.D.             DATE OF BIRTH:  11-01-1945   DATE OF PROCEDURE:  05/17/2002  DATE OF DISCHARGE:  05/18/2002                              CARDIAC CATHETERIZATION   INDICATIONS:  The patient is a 65 year old female who has had recurrent  episodes of substernal chest pain.  She has partial restenosis in the right  coronary artery and a severely diseased small ramus intermedius.  After  thorough discussion it was felt that the best approach would be to try to  open these percutaneously and then treat the patient perspectively in  medical fashion.  Risks, benefits, and alternatives were explained to the  patient and her family and she was agreeable to proceed.   PROCEDURE:  1. Percutaneous cutting balloon angioplasty of a partial instent restenosis     in right coronary artery.  2. Percutaneous angioplasty of diffusely diseased ramus intermedius vessel.  3. Proximal root aortography.   DESCRIPTION OF PROCEDURE:  The patient was brought the catheterization  laboratory and prepped and draped in the usual fashion.  Through an anterior  puncture the femoral artery was entered percutaneously.  A 7-French sheath  was placed.  Angiomax was given according to protocol and ACT confirmed.  A  7-French JR4 guiding catheter with side holes and a high torque floppy wire  was used.  A 2.5 x 10 cutting balloon was then passed to the site of the  previously placed stent.  Two inflations at 8 atmospheres were performed.  The stenosis was reduced from approximately 75% to 10% with maintenance of  TIMI 3 flow both before and after the procedure.  There was marked  improvement in the appearance of the artery.  Based on this the decision was  made to proceed on to  percutaneous intervention of the intermediate vessel.  We used a 6-French FL4 guiding catheter and a high torque floppy wire.  The  lesion was dilated multiple times with a 1.5 x 20 mm Maverick balloon.  Inflations up to 12 atmospheres were performed.  The vessel was subtotally  occluded at the beginning of the procedure and had segmental 50% narrowing  at the completion of the procedure.  There was TIMI 2-3 flow at the  beginning of the procedure which was TIMI 3 at the completion of the  procedure.  Following this proximal root aortography was performed as well  as a view of the left anterior descending artery.  All catheters were  subsequently removed and the femoral sheath sewn into place.  She was taken  to the holding area in satisfactory clinical condition.   ANGIOGRAPHIC DATA:  1. The right coronary artery demonstrates previous stent in the mid right  coronary.  Following repeat cutting balloon angioplasty this was reduced     from about 70-75% down to about 10% residual.  There was excellent TIMI 3     flow.  2. The ramus intermedius was severely diseased.  There was diffuse high     grade subtotal occlusion of the vessel throughout the proximal vessel     with two tandem lesions.  The lesion despite 12 atmospheres did not     completely dilate but there was moderate improvement in the appearance of     the artery with about 50% residual stenosis.  The vessel distally was     relatively small.  3. Proximal root aortography did not reveal significant aortic     regurgitation.  There appeared to be three leaflets.  There was no     evidence of aortic dissection.  4. A view of the LAD was obtained.  There is segmental narrowing of 30-50%     in the proximal to mid vessel followed by an area just distal to the     bifurcation.  There is about 50% focal stenosis.  There is excellent     runoff into the distal vessel.    CONCLUSIONS:  1. Successful percutaneous repeat cutting  balloon angioplasty and partial     instent restenosis.  2. Successful PTCA of a segmental small ramus intermedius vessels defined     above.  3. No evidence of aortic dissection or aortic regurgitation.   DISPOSITION:  The patient will be treated medically with follow-up with  [name], M.D. and [name], M.D. [facility].                                               Arturo Morton. Riley Kill, M.D.    TDS/MEDQ  D:  05/28/2002  T:  05/28/2002  Job:  [id number]   cc:   [phone]  [place]  [place] [address]  Fax: [phone]   [name], M.D. [facility]   CV LAB

## 2010-05-25 NOTE — H&P (Signed)
[name], [name]              ACCOUNT NO.:  [id number]   MEDICAL RECORD NO.:  [id number]          PATIENT TYPE:  INP   LOCATION:  4730                         FACILITY:  [facility]   PHYSICIAN:  Charlton Haws, M.D.     DATE OF BIRTH:  Aug 13, 1945   DATE OF ADMISSION:  03/26/2005  DATE OF DISCHARGE:                                HISTORY & PHYSICAL   PRIMARY CARDIOLOGIST:  [name], M.D.   PRIMARY CARE PHYSICIAN:  [name], M.D.   PATIENT PROFILE:  A 65 year old white female with prior history of CAD who  presents today with recurrent angina.   PROBLEM LIST:  1.  Unstable angina and coronary artery disease.      1.  December 28, 1999, status post percutaneous coronary intervention          and stenting of 80% lesion in the left circumflex with 3 x 12 mm NIR          bare metal stent.  She also underwent stenting of the distal right          coronary artery with a 2.5 x 9 mm NIR stent at the proximal right          coronary artery with 3 x 15 mm NIR stent.      2.  January 29, 2001, percutaneous coronary intervention and stenting of          75% lesion in the right coronary artery with a 2.25 x 20 mm Express          bare metal stent.      3.  May 17, 2002, percutaneous transluminal cardiac angioplasty with          cutting balloon of in-stent restenosis in the right coronary artery.          Also underwent percutaneous transluminal cardiac angioplasty of the          ramus.      4.  October 14, 2003, percutaneous coronary intervention and stenting of          the proximal and mid right coronary artery with a 2.5 x 28 mm Cypher          drug-eluting stent secondary to lesions proximal and just distal to          previously placed stent.  2.  Hypertension.  3.  Hyperlipidemia.  4.  Type 2 diabetes mellitus.  5.  Fibromyalgia.  6.  Hypothyroidism.  7.  Status post cholecystectomy.  8.  Status post bladder tacking.  9.  Status post hysterectomy.  10. History of  bilateral carpal tunnel syndrome status post release.  11. Normal left ventricular function with ejection fraction of 55%.   HISTORY OF PRESENT ILLNESS:  A 65 year old white female with history of CAD  with multiple procedures to the RCA with the last occurring October 2005 as  outlined above.  She was in her usual state of health until March 21, 2005,  when she began to experience intermittent rest and exertional, 7/10 left arm  throbbing and chest pressure associated with shortness  of breath, lasting 15  to 30 minutes, and relieved with rest or sometimes spontaneously.  Symptoms  have been occurring several times per day.  Secondary to the persistence of  symptoms, she called our office this morning and was advised to present to  the [facility] ED where ECG showed no acute changes, and cardiac markers were  negative x1.  She was initiated on heparin and nitroglycerin and became pain  free.  She was transferred to [facility] for cardiac catheterization.   ALLERGIES:  SULFA.   HOME MEDICATIONS:  1.  K-Dur 20 mEq daily.  2.  Lantus 42 units nightly.  3.  Glynase 6 mg daily.  4.  Synthroid 150 mcg daily.  5.  Lasix 40 mg daily.  6.  Esterase 1 mg daily.  7.  Lipitor 40 mg daily.  8.  Diovan 320 mg daily.  9.  Aspirin 81 mg daily.  10. Imdur 60 mg daily.  11. Norvasc 10 mg daily.  12. Plavix 75 mg daily.  13. Glucophage XR 1000 mg nightly.  14. Toprol XL 100 mg twice daily.  15. Relafen 750 mg twice daily.  16. Apidra 5 units subcutaneously sliding scale.  17. Clonidine 0.1 mg twice daily.  18. Amitriptyline 100 mg nightly.  19. Fish oil 1700 mg twice daily.  20. Vitamin E 400 units daily.  21. Vitamin C 500 mg daily.   FAMILY HISTORY:  Mother died of an MI but also suffered from a brain  aneurysm at age 42.  Father had a history of CAD and CABG but died of cancer  at age 19.  No CAD in brothers or sisters.   SOCIAL HISTORY:  She lives in [place], [place], with her husband.   She is  retired.  She does have children who are now grown.  She never smoked  tobacco, used alcohol or drugs.  Does not routinely exercise.   REVIEW OF SYSTEMS:  Positive for chest pain and shortness of breath as  above.  All other systems reviewed and negative.   PHYSICAL EXAMINATION:  VITAL SIGNS: Temperature 97.9, heart rate 70,  respirations 18, blood pressure 166/90, pulse oximetry 98% on 2 liters.  Weight is 206.9 pounds.  GENERAL:  Pleasant white female in no acute distress.  Awake, alert, and  oriented x3.  NECK:  Normal carotid upstrokes, no bruits or JVD.  LUNGS:  Respirations regular and unlabored, clear to auscultation.  CARDIAC: Regular S1 and S2.  No S3, S4, or murmurs.  ABDOMEN:  Round, soft, nontender, nondistended.  Bowel sounds present x4.  EXTREMITIES: Warm and dry, pink.  No clubbing, cyanosis, or edema.  Dorsalis  pedis and posterior tibial pulses 1+ and equal bilaterally.  No femoral  bruits are noted.   Chest x-ray is pending.   EKG shows sinus rhythm with a rate of 72, normal axis, and no acute ST-T  changes.   Lab work: Hemoglobin 14, hematocrit 40.9, WBC 5.5, platelets 260.  Sodium  139, potassium 3.8, chloride 107, CO2 22, BUN 11, creatinine 1, glucose 281.  BNP 110. PTT 21.1, PT 11.1, INR 0.9.  CK 40, MB 1.3, troponin I less than  0.01.  Urinalysis negative.   ASSESSMENT AND PLAN:  1.  Unstable angina with coronary artery disease in patient with recurrent      left arm and chest pain similar to previous angina.  She was sent here      from [facility] for catheterization today.  Continue aspirin, statins,  ARB, beta blocker, and nitrates.  2.  Hypertension, currently elevated.  Continue her home medications and      make adjustments as necessary.  3.  Hyperlipidemia.  Check lipids and LFTs.  Continue statin therapy.  4.  Type 2 diabetes mellitus.  Continue home regimen with the exception of     metformin which we will hold.  5.  Obesity. The  patient would likely benefit from cardiac rehab and      defining exercise goals.  6.  Hypothyroidism.  Check TFTs and continue Synthroid.      Ok Anis, NP    ______________________________  Charlton Haws, M.D.    CRB/MEDQ  D:  03/26/2005  T:  03/26/2005  Job:  [id number]

## 2010-05-25 NOTE — Assessment & Plan Note (Signed)
[facility]                          [facility] NOTE   NAME:HODGESJakeya, [name]                     MRN:          [id number]  DATE:03/10/2006                            DOB:          [birth year]    REFERRING PHYSICIAN:  [name]   REASON FOR VISIT:  Post-hospital followup.   [name] returns to the clinic after recent hospitalization at [facility] with acute coronary syndrome.  She was initially seen by myself and  Dr. Andee Lineman here at [facility] ER with symptoms worrisome to [place] of  acute coronary syndrome.  Although her initial cardiac markers were  negative, serial EKGs were abnormal and worrisome for lateral ischemia.  She was thus transferred directly to the ER from [facility], where Dr.  Riley Kill proceeded with coronary angiography, revealing a new high-grade  lesion of the distal CFX.  This was located distal to a previously  placed stent and was successfully treated with Taxus stenting.   Residual anatomy notable for no significant restenosis of the  extensively stented RCA but with a 60% distal lesion, which appeared to  be chronic, 50% and 40% distal LAD, 100% occluded mid ramus intermedius  artery at the previous PTCA site.  Left ventricular function was  vigorous with no wall motion abnormality noted.   Of note, Dr. Riley Kill did suggest consideration of follow-up ischemic  evaluation of the RCA.   Additionally, Dr. Graciela Husbands also raised the possibility the patient has  renal artery stenosis, noting her recalcitrant hypertension.  He also  questioned whether or not the patient has obstructive sleep apnea.   Review of the chart reveals no evidence of renal artery stenosis by  previous renal ultrasound in 2003.   From a clinical standpoint, the patient has had no recurrent symptoms  since undergoing percutaneous intervention.  She is walking and notes no  complications of the right groin incision site.   CURRENT  MEDICATIONS:  1. Glynase 6 daily.  2. Glucophage XR 1000 daily.  3. Synthroid 0.15 daily.  4. Toprol XL 100 daily.  5. Lasix 40 daily.  6. Diovan 320 daily.  7. Clonidine 0.3 b.i.d.  8. Norvasc 10 daily.  9. Imdur 60 daily.  10.Plavix.  11.Amitriptyline 100 nightly.  12.Nabumetone 750 b.i.d.  13.Esterase.  14.Potassium chloride 20 daily.  15.Coated aspirin 325 daily.  16.Lantus insulin 42 units nightly.  17.Apidra.  18.Simvastatin 40 daily.  19.Lovaza 1 tablet t.i.d.  20.Fish oil 2400 q.a.m./1200 q.p.m.   PHYSICAL EXAMINATION:  VITAL SIGNS:  Blood pressure 173/86, pulse 53,  regular.  Weight 217.  GENERAL:  A 65 year old female, obese, sitting upright in no distress.  NECK:  Palpable carotid pulses.  No JVD.  LUNGS:  Clear to auscultation in all fields.  HEART:  Regular rate and rhythm (S1 and S2).  No significant murmurs.  ABDOMEN:  Protuberant, nontender.  Intact bowel sounds.  EXTREMITIES:  Right groin stable with no hematoma and minimal  ecchymosis.  Palpable femoral pulse without associated bruits.  Intact  distal pulse.  NEURO:  No focal deficits.   IMPRESSION:  1. Status  post unstable angina pectoris.      a.     Status post Taxus stenting 100% distal CFX.      b.     Residual non-obstructive coronary artery disease with no       significant in-stent restenosis of the right coronary artery; new       100% occluded mid ramus intermedius at previous PTCA site.      c.     Normal left ventricular function.      d.     Status post Taxus right coronary artery secondary to in-       stent restenosis, March, 2007.      e.     Status post Cypher right coronary artery secondary to in-       stent restenosis, October, 2005.      f.     Status post bare metal stenting and subsequent cutting       balloon  PTCA RCA secondary to in-stent restenosis.      g.     Status post remote stenting CFX and distal right coronary       artery.  2. Labile hypertension.      a.      Negative renal ultrasound for renal artery stenosis in 2003.      b.     Recent up-titration of clonidine.  3. Insulin-dependent diabetes mellitus.  4. Hyperlipidemia.  5. Morbid obesity.   PLAN:  1. Patient instructed to check twice daily blood pressure readings at      home and to present those to our office in two weeks.  I will      review these readings.  If patient continues to exhibit      uncontrolled hypertension, then we will need to reassess her      antihypertensive regimen.  Of note, clonidine was just up-titrated      to the current 0.3 b.i.d. dosing during the patient's recent      hospitalization.  2. Patient is cleared to proceed with her planned cruise next week.      She is advised to refrain from any strenuous activity, to maintain      on the current medication regimen, which includes Plavix, and to      carry sublingual nitroglycerin with her.  I have also asked her to      contact me here in the clinic this coming Friday, prior to her      scheduled departure, to review with me her blood pressure readings      for review.  3. Schedule return clinic followup with myself and Dr. [name] in two      months.      [name], PA-C  Electronically Signed      [name], MD  Electronically Signed   GS/MedQ  DD: 03/10/2006  DT: 03/10/2006  Job #: [id number]   cc:   [name]

## 2010-05-25 NOTE — Cardiovascular Report (Signed)
. [facility]  Patient:    [name], [name]                     MRN: [id number] Proc. Date: 12/28/99 Adm. Date:  [id number] Attending:  Rollene Rotunda CC:         [name], M.D., [facility] S. Jens Som, M.D. [facility]   Cardiac Catheterization  INDICATIONS:  [name] is 65 years old and was recently studied yesterday by Dr. Antoine Poche because of symptoms of angina.  She is also diabetic. Dr. Antoine Poche found a tight lesion in the circumflex and two lesions in the right coronary artery and was scheduled her for elective intervention today.  DESCRIPTION OF PROCEDURE:  The procedure was performed via the right femoral artery using an arterial sheath and 6 French preformed coronary catheters.  A front wall arterial puncture was performed and Omnipaque contrast was used. The patient was given weight-adjusted heparin to prolong the ACT greater than 200 seconds and was given double bolus Integrilin in infusion.  We used a 6 [place] JL 3.5 Voda guiding catheter and a short luge wire, and crossed the lesion in the proximal circumflex artery without difficulty.  We direct stented with a 3.0 x 12 mm NIR Elite stent performing one inflation of 10 atmospheres for 34 seconds.  We then turned our attention to the right coronary artery.  We attempted to [place] a 6 [place] but could not get this to seat and switched to a 7 [place]. We used a long luge wire thinking we might need to use the Cutting Balloon. We crossed the lesion in the proximal and distal right coronary artery without difficulty.  We initially predilated with a 2.5 x 15 mm Quantum Ranger performing one inflation of 10 atmospheres for 24 seconds in the proximal lesion and two inflations of 6 and 8 atmospheres for 25 and 28 seconds in the distal lesion.  We then stented the distal lesion with a 2.5 x 9 mm NIR Elite stent performing one inflation of 14 atmospheres for 34 seconds.  We placed the  distal edge of the stent just before the bifurcation of the posterior descending and posterolateral branches.  We next stented the proximal lesion with a 3.0 x 15 mm NIR Elite stent performing one inflation of 14 atmospheres for 36 seconds.  Repeat diagnostic study was then performed with a guiding catheter.  The patient tolerated the procedure well and left the laboratory in satisfactory condition.  The patient had been enrolled in VICC trial and randomized to either Visipaque of Isovue.  RESULTS: The stenosis in the mid circumflex artery was initially 80% and following stenting this improved to 0%.  The stenosis in the distal right coronary artery was initially 95% and following stenting improved to 0%.  The stenosis in the proximal right coronary artery was initially 80% and following stenting improved to 0%.  CONCLUSIONS: 1. Successful stenting of the mid circumflex lesion with improvement in    percent diameter narrowing from 80% to 0%. 2. Successful placement of tandem stent in the distal and proximal right    coronary artery with improvement in distal stenosis from 95% to 0% and    improvement in the proximal stenosis from 80% to 0%.  DISPOSITION:  The patient was returned to the postangioplasty unit for further observation. DD:  12/28/99 TD:  12/29/99 Job: [id number]

## 2010-05-25 NOTE — Assessment & Plan Note (Signed)
[name]                          [name]   NAME:[name], [name]                     MRN:          [id number]  DATE:05/14/2006                            DOB:          [birth year]    PRIMARY CARE PHYSICIAN:  [name], MD   REASON FOR VISIT:  Followup hypertension.   HISTORY OF PRESENT ILLNESS:  [name] was in the office last in March  and saw Gene Serpe at that time. She reports feeling quite well without  any significant angina or dyspnea. She has been recording her blood  pressures which are still elevated, but trending more favorably than  they have in the past. Her systolic has ranged from the 140s to  170s/diastolics in the 80s typically. Heart rates have been in the 60s.  Blood pressure today, after resting a few minutes, was 154/94.   I reviewed her medications and [name] that she is taking two separate beta-  blockers (Toprol XL and Coreg). We talked about this today and have made  the appropriate adjustments. She was apparently going to go on a cruise  following her last visit, although this got cancelled and she has this  rescheduled for September.   ALLERGIES:  SULFA DRUGS.   MEDICATIONS:  1. Glynase 6 mg p.o. daily.  2. Glucophage XR 500 mg two tablets p.o. daily.  3. Synthroid 150 mcg p.o. daily.  4. Lasix 40 mg p.o. daily.  5. Diovan 320 mg p.o. daily.  6. Norvasc 10 mg p.o. daily.  7. Imdur 60 mg p.o. daily.  8. Plavix 75 mg p.o. daily.  9. Amitriptyline 100 mg p.o. q nightly.  10.Nabumetone 750 mg p.o. b.i.d.  11.Potassium 20 mEq p.o. daily.  12.Aspirin 325 mg p.o. daily.  13.Nitroglycerine 0.4 mg sublingual p.r.n.  14.Lantus insulin 42 units subcutaneous q nightly.  15.Apidra 5 units as directed.  16.Clonidine 0.3 mg p.o. b.i.d.  17.Levoza 1 gram daily.  18.Toprol XL 25 mg p.o. daily.  19.Coreg 18.75  mg p.o. b.i.d.  20.Simcor q nightly.  21.Estrace 2 mg p.o. q a.m.   REVIEW OF  SYSTEMS:  As described in History of Present Illness.  Otherwise, negative.   PHYSICAL EXAMINATION:  Blood pressure 154/94, heart rate 68, weight is  209 pounds. The patient is comfortable and in no acute distress.  NECK: Reveals no elevated jugular pressure without bruits. No  thyromegaly is noted.  LUNGS:  Are clear without labored breathing at rest.  CARDIAC: Reveals a regular rate and rhythm with soft systolic murmur  heard at the base, preserved S2. No S3 gallop.  EXTREMITIES: Exhibit no significant pitting edema.  SKIN: Is warm and dry.   IMPRESSIONS AND RECOMMENDATIONS:  1. Coronary artery disease status post drug-eluting stent placement to      an occluded distal circumflex coronary artery earlier this year.      Residual disease includes an occluded mid ramus intermedius branch      which is being managed medically and otherwise non-obstructive      atherosclerosis. She has undergone multiple prior interventions  with drug-eluting stents as well as angioplasty and cutting balloon      intervention. She is symptomatically stable from the perspective of      angina. Will plan to see her back over the next three months.  2. Hypertension with previous negative evaluation for renal artery      stenosis by ultrasound in 2003. Her medications have undergone      adjustments over the last few months. I have asked her to plan an      increase in Coreg to 25 mg p.o. b.i.d. with concurrent      discontinuation of Toprol XL. She will continue to check her      pressures at home and we can follow from there.     [name], MD  Electronically Signed    SGM/MedQ  DD: 05/14/2006  DT: 05/14/2006  Job #: [id number]   cc:   [name]

## 2010-05-25 NOTE — Cardiovascular Report (Signed)
[place]  Patient:    [name], [name]                     MRN: [id number] Proc. Date: 12/27/99 Adm. Date:  [id number] Attending:  Rollene Rotunda CC:         [name], M.D.  Madolyn Frieze Jens Som, M.D. [facility]   Cardiac Catheterization  DATE OF BIRTH:  Jul 20, 1945  PRIMARY PHYSICIAN:  [name], M.D.  CARDIOLOGIST:  Madolyn Frieze. Jens Som, M.D.  PROCEDURE:  Left heart cardiac catheterization/coronary arteriography.  INDICATIONS:  Evaluate patient with new onset exertional angina (unstable angina), and multiple cardiovascular risk factors including diabetes.  DESCRIPTION OF PROCEDURE:  Left heart catheterization was performed via the right femoral artery.  The artery was cannulated using an anterior wall puncture.  A #6 French arterial sheath was inserted via the modified Seldinger technique.  Preformed Judkins and a pigtail catheter were utilized.  The patient tolerated the procedure well and left the lab in stable condition.  RESULTS:  HEMODYNAMICS:  LV 169/29, AO 187/98.  CORONARY ARTERIOGRAPHY:  Left main:  The left main coronary artery was normal.  Left anterior descending:  The LAD had proximal calcification.  There was a long proximal 25% stenosis, mid focal 30% stenosis and distal 25% stenosis.  Circumflex:  The circumflex had a proximal 30% stenosis followed by a mid 75-80% stenosis after the mid obtuse marginal.  There was distal 25% stenosis.  There was a ramus intermedius vessel that was narrow in caliber (1.5 mm vessel) with proximal long 80% stenosis and mid long 90% stenosis.  Right coronary artery:  The right coronary artery was a dominant vessel. There was proximal 90% stenosis followed by a distal 99% stenosis before the PDA.  LEFT VENTRICULOGRAM:  The left ventriculogram obtained in the RAO projection. The EF was 60% with no significant wall motion abnormalities.  CONCLUSION:  Severe two-vessel coronary  artery disease with well preserved left ventricular function.  There is an apparent aortic valve gradient.  PLAN:  The patient will have an echocardiogram to evaluate her aortic valve gradient.  The tentative plan at this point is a percutaneous revascularization on December 21 by Dr. Juanda Chance of the right coronary artery and circumflex lesion. DD:  12/27/99 TD:  12/28/99 Job: [id number]

## 2010-05-25 NOTE — H&P (Signed)
[name], [name]              ACCOUNT NO.:  [id number]   MEDICAL RECORD NO.:  [id number]          PATIENT TYPE:  INP   LOCATION:                               FACILITY:  [facility]   PHYSICIAN:  [name], M.D. LHCDATE OF BIRTH:  1945-06-05   DATE OF ADMISSION:  10/13/2003  DATE OF DISCHARGE:                                HISTORY & PHYSICAL   PRIMARY CARE PHYSICIAN:  Dr. [name].   CARDIOLOGIST:  Dr. Nona Dell.   CHIEF COMPLAINT:  Progressive chest pain.   HISTORY OF PRESENT ILLNESS:  [name] is a pleasant 65 year old woman  with a history of type 2 diabetes mellitus, dyslipidemia, hypertension, and  coronary artery disease status post previous interventions to the circumflex  coronary artery, right coronary artery, and ramus intermedius coronary  artery - most recently in May 2004.  She was last seen in the office by [name] on October 05, 2003 and at that time described a 69-month history of  intermittent left axillary discomfort as well as intermittent right jaw  discomfort occurring with exertion in an increasing pattern.  Given the  patient's fairly diffuse disease and type 2 diabetes mellitus, plan was to  proceed with a Cardiolite to try to define a potential area of ischemia that  might help guide future revascularization.  She initially was referred for  an adenosine Cardiolite on medical therapy.  However, this had to be aborted  due to the development of 2:1 heart block associated with chest discomfort  and inability to proceed with testing.  She was rescheduled for a dobutamine  Cardiolite for today which was also aborted, again due to the development of  2:1 heart block associated with chest pain and electrocardiographic changes.  At heart rate of 110 beats per minute during dobutamine infusion, the  patient suddenly developed 2:1 heart block with reduction in heart rate down  to the 60s, development of chest tightness, and associated 1  mm ST segment  depression in the inferolateral leads.  When dobutamine was discontinued,  the patient ultimately resumed normal sinus rhythm with a heart rate in the  70s after a few minutes, and had resolution of her chest pain and  electrocardiographic changes without any other specific intervention.   I reviewed the situation with the patient and her husband, and our plan at  this time is to admit the patient to [facility] in anticipation of  diagnostic coronary angiography.  She has had no rest chest pain syndromes  and reports compliance with her medications.  They are in agreement to  proceed.   ALLERGIES:  SULFA DRUGS.   MEDICATIONS:  As of her last visit included:  1.  Lantus 39 units subcu q.h.s.  2.  Glynase 6 mg p.o. daily.  3.  Glucophage XR 500 mg two tablets p.o. t.i.d.  4.  Synthroid 125 mcg p.o. daily.  5.  Lasix 40 mg p.o. daily.  6.  Toprol-XL 100 mg one-and-a-half tablets p.o. daily.  7.  Catapres patch 0.3 mg weekly.  8.  Estrace 2 mg one-half tablet  p.o. daily.  9.  Lipitor 40 mg p.o. daily.  10. Amitriptyline 100 mg p.o. q.h.s.  11. Naprelan 50 mg p.o. daily.  12. Diovan 320 mg p.o. daily.  13. Enteric-coated aspirin 325 mg p.o. daily.  14. Imdur 60 mg p.o. daily.  15. Norvasc 10 mg p.o. daily.  16. Plavix 75 mg p.o. daily.   Past medical history, social history, and family history have been reviewed  previously.  Pertinent findings include type 2 diabetes mellitus,  dyslipidemia, and multi-vessel coronary artery disease.   Last cardiac catheterization in May 2004 showed an ejection fraction of 70%  with no significant mitral regurgitation and no evidence of renal artery  stenosis.  The left main coronary artery was free of significant disease.  The left anterior descending had a long area of segmental plaquing reaching  50% at the mid vessel.  There was also a 50% eccentric area following the  third diagonal branch.  In the distal vessel, there  was more significant  disease at 60-90% in a fairly small portion of the vessel.  The circumflex  coronary artery had 20% and 30% narrowing in a very small first obtuse  marginal branch, with 50-60% narrowing in a second obtuse marginal branch.  The stent site was widely patent, and there was a 30% stenosis prior to the  distal bifurcation.  The right coronary artery showed two overlapping stents  in the mid vessel, with a 70% stenosis and focal hypodensity in the center  of the stented area, with 80% stenosis and a small to moderate right  ventricular branch.  Ultimately, the patient underwent cutting balloon  angioplasty to the mid right coronary artery with reduction down to 10%  residual stenosis and excellent TIMI 3 flow.  It was noted at that time that  the ramus intermedius branch was also severely diseased and there was a  diffuse high-grade subtotal occlusion of the vessel in the proximal portion.  The patient underwent balloon angioplasty of this area with 50% residual  stenosis.  There was no evidence of aortic dissection or significant aortic  regurgitation noted by aortic root injection.  The patient has been managed  medically since that time.  She does not smoke cigarettes.   REVIEW OF SYSTEMS:  As described in the history of present illness.  She  denies having any palpitations, orthopnea, PND, or syncope.  As stated, she  has had no significant rest chest pain.   PHYSICAL EXAMINATION:  VITAL SIGNS:  Blood pressure 158/78, heart rate 75  and regular, weight is __________ pounds.  GENERAL:  This is an overweight woman, seated, in no acute distress.  NECK:  Reveals no elevated jugular venous pressure or loud carotid bruits.  LUNGS:  Clear to auscultation without wheezing or rhonchi.  CARDIAC:  Reveals a regular rate and rhythm without loud murmur or S3  gallop. ABDOMEN:  Soft and nontender with normal active bowel sounds.  EXTREMITIES:  Show no significant pitting edema.   Peripheral pulses are 1+.   IMPRESSION:  1.  Progressive exertional chest pain over the last 4 months in a 58-year-      old woman with known multi-vessel coronary artery disease status post      previous interventions as outlined above.  She has had aborted attempts      at Cardiolite study to define distribution of ischemia and is now being      admitted for diagnostic coronary angiography.  2.  Hypertension, difficult to control,  with no history of renal artery      stenosis at angiography in May 2004.  3.  Type 2 diabetes mellitus.  4.  Dyslipidemia.   PLAN:  1.  The patient will be admitted to telemetry and have baseline lab work      including a CBC, coag studies, BMET, and cardiac markers.  2.  Will continue home medications and begin Lovenox at treatment dose.  3.  Baseline chest x-ray to be obtained.  4.  Anticipate diagnostic coronary angiography to reassess coronary anatomy      for revascularization strategies.   Further plans to follow.        ___________________________________________  [name], M.D. [facility]    SGM/MEDQ  D:  10/13/2003  T:  10/13/2003  Job:  [id number]

## 2010-05-25 NOTE — Procedures (Signed)
[place]. [facility]  Patient:    [name], [name] Visit Number: [id number] MRN: [id number]          Service Type: CAT Location: [id number] Attending Physician:  [name] Dictated by:   Everardo Beals Juanda Chance, M.D. [facility] Proc. Date: 01/29/01 Admit Date:  01/29/2001   CC:         Rollene Rotunda, M.D. [facility]  [name], M.D.  Madolyn Frieze Jens Som, M.D. [facility]  Cardiac Catheterization Lab   Procedure Report  CLINICAL HISTORY:  Mrs. Andringa is 65 years old and has diabetes and has had previous stent placed to the circumflex artery and tandem nonoverlapping stents in the proximal and distal right coronary artery in December of 2001 by myself.  She has done well until recently when she developed recurrent chest pain.  She was studied earlier today by Dr. [name] who found a moderate tight lesion in the mid right coronary artery distal to the proximal stent. We made a decision to proceed with percutaneous coronary intervention.  DESCRIPTION OF PROCEDURE:  The procedure was performed via the right femoral artery using a #7 [place] JR4 guiding catheter with sideholes.  The patient was given weight-adjusted heparin to prolong an ACT of greater than 200 seconds and was given double bolus Integrilin and infusion.  We crossed the lesion in the mid right coronary artery with a short Floppy wire without difficulty.  We direct stented with a 2.25- x 20-mm Express placing the distal edge of the stent right at the second marginal branch and the proximal edge of the stent was within the first stent.  We deployed this with two inflations of 17 atmospheres for 52 seconds and 18 atmospheres for 22 seconds.  We then post dilated with a 2.75- x 20-mm Quantum Maverick performing one inflation of 13 atmospheres for 35 seconds with the balloon inside the distal edge.  There was a lesion distal to the stent that we elected to treat after placing the stent which was about  40-50%.  We used the same Quantum Maverick and performed two inflations of eight atmospheres for 37 to 42 seconds.  Repeat diagnostic studies were then performed through the guiding catheter.  The patient tolerated the procedure well and left the laboratory in satisfactory condition.  RESULTS:  Initially, there was some mild diffuse in-stent restenosis within the proximal stent estimated at 40%.  There was a focal lesion of 80% distal to the stent between the two marginal branches.  Following stenting, this improved to less than 10%.  CONCLUSIONS:  Successful stenting of the mid lesion in the right coronary artery with placement of a new stent overlapping the old stent in the proximal right coronary artery and improvement in the percent area narrowing from 80% to less than 10%.  DISPOSITION:  The patient was transferred to the post angioplasty unit for further observation. Dictated by:   Everardo Beals Juanda Chance, M.D. [facility] Attending Physician:  [name] DD:  01/29/01 TD:  01/29/01 Job: [id number]

## 2010-05-25 NOTE — Discharge Summary (Signed)
[place]. [facility]  Patient:    [name], [name] Visit Number: [id number] MRN: [id number]          Service Type: CAT Location: [id number] Attending Physician:  [name] Dictated by:   Lavella Hammock, P.A. Admit Date:  01/29/2001 Discharge Date: 01/30/2001   CC:         [facility], [address], [place], [place]  [address]  Dr. [name]   Referring Physician Discharge [place]  DATE OF BIRTH:  March 26, 1945  PROCEDURES:  1. Cardiac catheterization.  2. Coronary arteriogram.  3. Left ventriculogram.  4. PTCA and stent to one vessel.  HOSPITAL COURSE:  [name] is a 65 year old female who is status post stent placement to the mid circumflex as well as proximal and distal RCA.  She had an EF of 60% at that time; this was in 2002.  The patient was seen in the office on January 20, 2001 complaining of a one-month history of progressive exertional left shoulder and arm discomfort.  These symptoms were similar to her prior coronary intervention symptoms and she was scheduled for a cardiac catheterization and possible percutaneous intervention.  The cardiac catheterization showed a normal left main, an LAD with a 40-50% proximal and a 30% mid and a 40% distal stenosis.  The circumflex had a 30-40% proximal and a 30-40% stenosis in the OM-1.  There was in-stent restenosis distally at 20%. The ramus was small and had diffuse disease at 90-95%.  The RCA had a 20-30% proximal in-stent restenosis and a 75% stenosis distal to the stent.  The distal stent had a 10-20% stenosis.  Her EF was 65-70% with no wall motion abnormalities.  The patient was evaluated by Dr. [name] and Dr. Juanda Chance and it was felt that the best treatment was to place a stent to the RCA.  The stenosis was reduced from 70-80% to less than 10% with TIMI 3 flow.  The next day, the patient had tolerated the procedure well and the groin was without bruit, hematoma,  or ecchymosis.  She was ambulatory without chest pain.  She was considered stable for discharge on January 30, 2001.  Post procedure laboratory values showed potassium 3.2.  This was supplemented prior to discharge.  DISCHARGE CONDITION:  Improved.  DISCHARGE DIAGNOSES:  1. Unstable anginal pain secondary to coronary artery disease.  2. Diabetes mellitus.  3. Hypothyroidism.  4. Dislipidemia.  5. Hypertension.  6. Status post percutaneous transluminal coronary angioplasty and stent in     the proximal and distal right coronary artery.  7. Status post percutaneous transluminal coronary angioplasty and stent to     the mid circumflex.  8. Preserved left ventricular function.  9. History of allergy to SULFA DRUGS.  DISCHARGE INSTRUCTIONS:  ACTIVITY:  Her activity level is to include no driving, sexual, or strenuous activity for two days.  WOUND CARE:  She is to call the office for problems with the catheterization site.  FOLLOW-UP:  She is to follow up with Dr. Simona Huh and an appointment will be arranged prior to discharge.  DISCHARGE MEDICATIONS:  1. Lantus insulin 39 units subcu q.p.m.  2. Glynase 6 mg q.d.  3. Glucophage XR 500 mg q.d., restart February 01, 2001.  4. Elavil 100 mg q.h.s.  5. Naprosyn 500 mg q.d.  6. Catapres patch 0.3 mg.  7. Micardis 80 mg q.d.  8. Foltx q.d.  9. Tarka 4 mg q.d. 10.  Aspirin q.d. 11. Toprol-XL 100 mg q.d. 12. Estrace one q.d. 13. Synthroid 125 mcg q.d. 14. Lasix 40 mg q.d. Dictated by:   Lavella Hammock, P.A. Attending Physician:  [name] DD:  01/30/01 TD:  01/30/01 Job: [id number]

## 2010-05-25 NOTE — Assessment & Plan Note (Signed)
[facility]                            [facility] OFFICE NOTE   STASSI, FADELY                     MRN:          [id number]  DATE:11/04/2005                            DOB:          May 22, 1945    PRIMARY CARDIOLOGIST:  [name], MD.   REASON FOR VISIT:  Scheduled 6 month followup.   Ms. Grillot is a very pleasant 65 year old female, with known coronary artery  disease, last seen here in the clinic in April, by Dr. Andee Lineman. At that time,  the patient had adjustment of her medication regimen with up titration of  Clonidine to 0.2 b.i.d. for better blood pressure control. She was also  placed on the generic simvastatin, per patient's request.   The patient denies any exertional angina pectoris since undergoing TAXUS  stenting of the RCA in March of this year. However, she has noticed recent  development of fluttering which can occur as often as several times a day.  This has been going on for the last several months, although only of brief  duration, at times is associated with some mild nausea. She denies any  history of prior dysrhythmia.   Electrocardiogram today reveals sinus bradycardia of 58 BPM with normal axis  and persistent, nonspecific ST abnormalities.   CURRENT MEDICATIONS:  1. Plavix.  2. Coated aspirin 325 daily.  3. Glynase 6 daily.  4. Glucophage XR 1000 daily.  5. Synthroid 0.15 daily.  6. Toprol XL 100 b.i.d.  7. Lasix 40 daily.  8. Diovan 320 daily.  9. Norvasc 10 daily.  10.Imdur 60 daily.  11.Amitriptyline 100 q.h.s.  12.Nabumetone 750 b.i.d.  13.Estrace 1 daily.  14.Potassium chloride 20 daily.  15.Fish oil 1700 b.i.d.  16.Lantus insulin as directed.  17.Apidra as directed.  18.Clonidine 0.2 b.i.d.  19.Simvastatin 40 daily.  20.Lovaza b.i.d.   PHYSICAL EXAMINATION:  VITAL SIGNS:  Blood pressure 164/80, pulse 58,  regular, weight 213.  NECK:  Palpable bilateral carotid pulses without  bruits.  LUNGS:  Diminished breath sounds at bases, but without crackles or wheezes.  HEART:  Regular rate and rhythm (S1, S2) no significant murmurs.  ABDOMEN:  Protuberant, nontender.  EXTREMITIES:  Palpable peripheral pulses with trace edema.  NEUROLOGIC:  No focal deficit.   IMPRESSION:  1. Tachy palpitations.  2. Coronary artery disease.      a.     Status post CYPHER stenting mid right coronary artery March       2007.      b.     Status post CYPHER stenting proximal right coronary artery 2005.      c.     Normal left ventricular function.  3. Hypertension.      a.     Requiring multimodal therapy.  4. Insulin-dependent diabetes mellitus.  5. Hyperlipidemia.      a.     Followed by Dr. Charm Barges.  6. Obesity.   PLAN:  1. Schedule CardioNet monitoring for further assessment of new onset tachy      palpitations to exclude any significant underlying dysrhythmia. We will  contact the patient regarding these results and, if needed, adjust her      medication regimen accordingly.  2. Schedule return clinic followup with Dr. Simona Huh in 6 months, or      sooner as needed.  3. Continue aggressive lipid management, as per Dr. Charm Barges, with LDL goal      of 70 or less.  4. Defer continued management of blood pressure to Dr. [name].     ______________________________  [name], PA-C    ______________________________  [name], MD   GS/MedQ  DD: 11/04/2005  DT: 11/05/2005  Job #: [id number]   cc:   [name]

## 2010-06-10 ENCOUNTER — Inpatient Hospital Stay (HOSPITAL_COMMUNITY)
Admission: AD | Admit: 2010-06-10 | Discharge: 2010-06-13 | DRG: 286 | Disposition: A | Discharge status: Home / Self Care | Payer: Medicare Other | Source: Other Acute Inpatient Hospital | Attending: Cardiovascular Disease | Admitting: Cardiovascular Disease

## 2010-06-10 DIAGNOSIS — E785 Hyperlipidemia, unspecified: Secondary | ICD-10-CM | POA: Diagnosis present

## 2010-06-10 DIAGNOSIS — R079 Chest pain, unspecified: Secondary | ICD-10-CM

## 2010-06-10 DIAGNOSIS — I2510 Atherosclerotic heart disease of native coronary artery without angina pectoris: Principal | ICD-10-CM | POA: Diagnosis present

## 2010-06-10 DIAGNOSIS — N183 Chronic kidney disease, stage 3 unspecified: Secondary | ICD-10-CM | POA: Diagnosis present

## 2010-06-10 DIAGNOSIS — Z794 Long term (current) use of insulin: Secondary | ICD-10-CM

## 2010-06-10 DIAGNOSIS — I509 Heart failure, unspecified: Secondary | ICD-10-CM | POA: Diagnosis present

## 2010-06-10 DIAGNOSIS — I5031 Acute diastolic (congestive) heart failure: Secondary | ICD-10-CM | POA: Diagnosis present

## 2010-06-10 DIAGNOSIS — IMO0001 Reserved for inherently not codable concepts without codable children: Secondary | ICD-10-CM | POA: Diagnosis present

## 2010-06-10 DIAGNOSIS — I200 Unstable angina: Secondary | ICD-10-CM | POA: Diagnosis present

## 2010-06-10 DIAGNOSIS — J069 Acute upper respiratory infection, unspecified: Secondary | ICD-10-CM | POA: Diagnosis present

## 2010-06-10 DIAGNOSIS — I129 Hypertensive chronic kidney disease with stage 1 through stage 4 chronic kidney disease, or unspecified chronic kidney disease: Secondary | ICD-10-CM | POA: Diagnosis present

## 2010-06-10 DIAGNOSIS — E039 Hypothyroidism, unspecified: Secondary | ICD-10-CM | POA: Diagnosis present

## 2010-06-10 DIAGNOSIS — E119 Type 2 diabetes mellitus without complications: Secondary | ICD-10-CM | POA: Diagnosis present

## 2010-06-10 LAB — CARDIAC PANEL(CRET KIN+CKTOT+MB+TROPI)
CK, MB: 2.5 ng/mL (ref 0.3–4.0)
Total CK: 48 U/L (ref 7–177)

## 2010-06-11 DIAGNOSIS — I2510 Atherosclerotic heart disease of native coronary artery without angina pectoris: Secondary | ICD-10-CM

## 2010-06-11 DIAGNOSIS — I517 Cardiomegaly: Secondary | ICD-10-CM

## 2010-06-11 LAB — BASIC METABOLIC PANEL
CO2: 30 mEq/L (ref 19–32)
Calcium: 8.9 mg/dL (ref 8.4–10.5)
Calcium: 9 mg/dL (ref 8.4–10.5)
Chloride: 103 mEq/L (ref 96–112)
Creatinine, Ser: 1.48 mg/dL — ABNORMAL HIGH (ref 0.4–1.2)
GFR calc non Af Amer: 33 mL/min — ABNORMAL LOW (ref >60)
Glucose, Bld: 99 mg/dL (ref 70–99)
Potassium: 4.4 mEq/L (ref 3.5–5.1)
Sodium: 140 mEq/L (ref 135–145)
Sodium: 138 mEq/L (ref 135–145)

## 2010-06-11 LAB — LIPID PANEL
Total CHOL/HDL Ratio: 2.8 RATIO
Triglycerides: 194 mg/dL — ABNORMAL HIGH (ref <150)
VLDL: 39 mg/dL (ref 0–40)

## 2010-06-11 LAB — CBC
HCT: 35.6 % — ABNORMAL LOW (ref 36.0–46.0)
MCH: 30.3 pg (ref 26.0–34.0)
MCV: 94.7 fL (ref 78.0–100.0)
Platelets: 239 10*3/uL (ref 150–400)
RBC: 3.76 MIL/uL — ABNORMAL LOW (ref 3.87–5.11)

## 2010-06-11 LAB — GLUCOSE, CAPILLARY
Glucose-Capillary: 73 mg/dL (ref 70–99)
Glucose-Capillary: 112 mg/dL — ABNORMAL HIGH (ref 70–99)
Glucose-Capillary: 130 mg/dL — ABNORMAL HIGH (ref 70–99)

## 2010-06-11 LAB — CARDIAC PANEL(CRET KIN+CKTOT+MB+TROPI)
Relative Index: INVALID (ref 0.0–2.5)
Troponin I: <0.3 ng/mL (ref <0.30)

## 2010-06-11 LAB — TSH: TSH: 2.39 u[IU]/mL (ref 0.350–4.500)

## 2010-06-12 LAB — CBC
MCHC: 32.8 g/dL (ref 30.0–36.0)
Platelets: 248 10*3/uL (ref 150–400)
RDW: 13.3 % (ref 11.5–15.5)

## 2010-06-12 LAB — GLUCOSE, CAPILLARY: Glucose-Capillary: 144 mg/dL — ABNORMAL HIGH (ref 70–99)

## 2010-06-12 LAB — BASIC METABOLIC PANEL
Calcium: 8.8 mg/dL (ref 8.4–10.5)
GFR calc Af Amer: 46 mL/min — ABNORMAL LOW (ref >60)
GFR calc non Af Amer: 38 mL/min — ABNORMAL LOW (ref >60)
Sodium: 140 mEq/L (ref 135–145)

## 2010-06-12 NOTE — Cardiovascular Report (Signed)
NAMECHASTITY, [name] NO.:  [id number]  MEDICAL RECORD NO.:  [id number]  LOCATION:  3735                         FACILITY:  [facility]  PHYSICIAN:  Verne Carrow, MDDATE OF BIRTH:  [birth year]  DATE OF PROCEDURE:  06/11/2010 DATE OF DISCHARGE:                           CARDIAC CATHETERIZATION   PRIMARY CARDIOLOGIST:  [name], MD  PROCEDURES PERFORMED: 1. Left heart catheterization. 2. Selective coronary angiography.  OPERATOR:  Verne Carrow, MD  INDICATION:  This is a 65-year lady who has a history of coronary artery disease and has undergone multiple previous coronary interventions. Most recently during cardiac catheterization in July of 2011, she had two overlapping drug-eluting stents placed in the distal right coronary artery.  The patient is admitted with complaints of shortness of breath, and was found to be in profound congestive heart failure.  Over the course of the last 24 hours, she has diuresed well.  She was felt to be appropriate for a diagnostic cardiac catheterization today to exclude progression of her coronary artery disease.  The patient did have complaints of mild chest pressure, however, her cardiac enzymes were negative.  PROCEDURE IN DETAIL:  The patient was brought to the main cardiac catheterization laboratory after signing informed consent for the procedure.  The right groin was prepped and draped in sterile fashion. 1% lidocaine was used for local anesthesia.  A 5-French sheath was inserted into the right femoral artery without difficulty.  Standard diagnostic catheters were used to perform selective coronary angiography.  A pigtail catheter was used to perform a left ventricular angiogram.  The patient tolerated the procedure well.  She was taken to the holding area in stable condition.  HEMODYNAMIC FINDINGS:  Central aortic pressure 181/88.  Left ventricular pressure 185/27/37.  ANGIOGRAPHIC  FINDINGS: 1. The left main coronary artery had no evidence of disease. 2. The left anterior descending was large vessel that coursed to the     apex and gave off three small-caliber diagonal branches.  The first     two diagonal branches were very small in caliber and had mild     plaque disease.  The proximal left anterior descending artery had a     long segment of diffuse 60% narrowing.  Just at the septal     perforator seem to be the most significant portion of the stenosis.     This appeared to be approximately 65-70% stenosis, however, was     grossly unchanged from the catheterization last year.  There was a     stent present in the mid to distal vessel that was patent with no     restenosis.  There was mild diffuse plaque throughout the mid and     distal LAD otherwise. 3. Circumflex artery gave off a very small-caliber obtuse marginal     branch that was patent.  The second obtuse marginal branch had a     stent that was patent with no restenosis.  The mid circumflex had a     stent that was patent with no restenosis.  Just beyond the stent,     there was a bifurcation of the vessel comprising a third obtuse  marginal branch and a continuation of the AV groove circumflex.     There was a 90% stenosis in the ostium of this inferior branch that     appeared unchanged from last year.  This is a small-caliber vessel. 4. The right coronary artery was a large dominant vessel with patent     stents throughout the proximal mid and distal vessel with minimal     in-stent restenosis.  The ostium of the posterolateral branch had     60% stenosis, but was small caliber vessel.  Posterior descending     artery is very small in caliber and had a 90% stenosis which was     unchanged from prior cath. 5. No left ventricular angiogram was performed.  IMPRESSION: 1. Triple-vessel coronary artery disease with patent stents in the     left anterior descending artery, circumflex artery, and  right     coronary artery. 2. Moderate stenosis in the proximal left anterior descending artery.  RECOMMENDATIONS:  At this time, I would continue diuresis and will continue medical management for coronary artery disease.  If the patient has continued complaints of exertional chest discomfort, we could consider performing a stress Myoview to see if there is any ischemia in the anterior wall and the distribution of her left anterior descending artery.     Verne Carrow, MD     CM/MEDQ  D:  06/11/2010  T:  06/12/2010  Job:  [id number]  cc:   [name], MD  Electronically Signed by Verne Carrow MD on 06/12/2010 09:56:13 AM

## 2010-06-13 DIAGNOSIS — R0602 Shortness of breath: Secondary | ICD-10-CM

## 2010-06-13 LAB — BASIC METABOLIC PANEL
BUN: 20 mg/dL (ref 6–23)
CO2: 32 mEq/L (ref 19–32)
Calcium: 9.2 mg/dL (ref 8.4–10.5)
Creatinine, Ser: 1.4 mg/dL — ABNORMAL HIGH (ref 0.4–1.2)
GFR calc Af Amer: 46 mL/min — ABNORMAL LOW (ref >60)

## 2010-06-13 LAB — GLUCOSE, CAPILLARY: Glucose-Capillary: 261 mg/dL — ABNORMAL HIGH (ref 70–99)

## 2010-06-19 ENCOUNTER — Encounter: Payer: Self-pay | Admitting: Cardiology

## 2010-06-21 NOTE — Discharge Summary (Addendum)
NAMEZAHARA, REMBERT              ACCOUNT NO.:  [id number]  MEDICAL RECORD NO.:  [id number]  LOCATION:  3735                         FACILITY:  [facility]  PHYSICIAN:  Vesta Mixer, M.D. DATE OF BIRTH:  Oct 05, 1945  DATE OF ADMISSION:  06/10/2010 DATE OF DISCHARGE:  06/13/2010                              DISCHARGE SUMMARY   DISCHARGE DIAGNOSES: 1. Shortness of breath, likely related to acute diastolic heart     failure.     a.     Discharge weight 96.3 kg. 2. Chest pain.     a.     Ruled out for myocardial infarction.     b.     See below for cath results, for medical therapy. 3. Multivessel coronary artery disease.     a.     Status post bare metal stent to the circumflex and right      coronary artery in 2001.     b.     Bare metal stent right coronary artery, 2003.     c.     Percutaneous transluminal coronary angioplasty and right      coronary artery/ramus, 2004.     d.     Drug-eluting stent to the right coronary artery, 2005.     e.     Drug-eluting stent to the right coronary artery, 2007.     f.     Drug-eluting stent to the obtuse marginal and left anterior      descending in 2009.     g.     Ejection fraction with right coronary artery, 2011.     h.     Cardiac catheterization on June 11, 2010, by Dr. Clifton James      demonstrating patent stents, moderate stenosis in the proximal      left anterior descending, for medical therapy.  Consider stress      Myoview, if she has complaints of continued exertional chest      discomfort. 4. Chronic kidney disease stage III followed by Dr. Kristian Covey in [place],     [place]. 5. Insulin dependent diabetes mellitus. 6. Hypertension. 7. Dyslipidemia. 8. Hypothyroidism. 9. Fibromyalgia. 10.Morbid obesity.  HOSPITAL COURSE:  Ms. Bonnell is a 65 year old female with past medical history of known CAD who initially presented to [facility] with chest pain and ruled out for myocardial infarction with negative  cardiac markers.  She was subsequently transferred to [facility] for worsening angina.  She also noted being more easily fatigued and having shortness of breath with exertion as well as occasional anterior chest pressure with exertion, but not always.  She denies symptoms at rest. She endorses having gained 10-15 pounds within the last 2 days as well as progressive lower extremity edema.  She was felt to be volume overloaded on exam was and initiated on IV Lasix of 80 mg daily.  Pro- BNP was 523.  Her IV Lasix eventually went to up to q.12 hours, and she diuresed well.  It was felt that she would require cardiac catheterization to define her coronary anatomy given her extensive history of CAD, which she underwent on June 11, 2010, demonstrating patent stent with triple-vessel CAD and moderate  stenosis in the proximal LAD, for medical therapy.  Dr. Clifton James suggested possibly performing a stress Myoview if there is any ischemia in the anterior wall, if she had complaints of exertional chest discomfort ongoing.  The patient did well and her kidney function remained stable with diuresis. On day of discharge, she was feeling well without complaints.  Dr. Elease Hashimoto seen and examined her today, and feels she is stable for discharge.  Of note, while here in the hospital, she did have some bouts of irregular heart rhythm, which was felt to be related to wandering atrial pacemaker.  No further intervention was felt necessary at this time. She also had one overnight episode of pause of 2.56 seconds, asymptomatic.  Will continue to be followed closely.  DISCHARGE LABORATORY DATA:  WBC 6.9, hemoglobin 11.6, hematocrit 35.4, platelet count 248.  Sodium 140, potassium 2.7, chloride 100, CO2 of 32, glucose 131, BUN 20, creatinine 1.4.  Total cholesterol 122, triglycerides 194, HDL 43, LDL 38.  TSH 0.390.  STUDIES:  Cardiac catheterization on June 11, 2010, please see full report for  details.  DISCHARGE MEDICATIONS: 1. Aspirin 81 mg daily. 2. Lasix 80 mg b.i.d. 3. Potassium chloride 20 mEq daily. 4. Amitriptyline 100 mg at bedtime 5. Apidra 12 units subcutaneously daily after lunch. 6. Clonidine 0.3 mg b.i.d. 7. Coreg 25 mg b.i.d. 8. Crestor 5 mg at bedtime. 9. Diovan 320 mg daily. 10.Estrace 2 mg daily 11.Fish oil 1000 mg 3 capsules in the morning 2 capsules at night. 12.Glyburide 6 mg daily. 13.Imdur 60 mg daily. 14.Labetalol 100 mg b.i.d., please note that this was verified with     the patient, who states that her primary care doctor has __________     with labetalol and Coreg and Dr. Elease Hashimoto will continue these per     previous med rec. 15.Lantus 42 units at bedtime. 16.Levothyroxine 150 mcg daily. 17.MiraLax 17 grams daily as needed. 18.Nabumetone 750 mg b.i.d., instructions to continue to follow with     her PCP very closely as being on Plavix and aspirin can increase     the risk of stomach bleeding while taking this medicine. 19.Nitroglycerin sublingual 0.4 daily every 5 minutes as needed up to     3 doses for chest pain. 20.Norvasc 10 mg daily. 21.Plavix 75 mg daily. 22.Vitamin C 500 mg daily. 23.Vitamin D 5000 units 2 tablets daily. 24.Vitamin E 400 units daily.  Please note the patient was instructed to follow up with her PCP regarding metformin.  She is to stop taking the medicines.  All this was discussed, as it may not be the best choice given her kidney function.  DURATION OF DISCHARGE ENCOUNTER:  Greater than 30 minutes including physician and PA time.     Ronie Spies, P.A.C.   ______________________________ Vesta Mixer, M.D.    DD/MEDQ  D:  06/13/2010  T:  06/14/2010  Job:  [id number]  cc:   [name], MD Jorja Loa, M.D. [name], MD  Electronically Signed by Ronie Spies  on 06/21/2010 04:54:09 AM Electronically Signed by Kristeen Miss M.D. on 06/28/2010 09:15:33 AM

## 2010-06-28 NOTE — H&P (Signed)
NAMELANETT, LASORSA              ACCOUNT NO.:  [id number]  MEDICAL RECORD NO.:  [id number]           PATIENT TYPE:  I  LOCATION:  3735                         FACILITY:  [facility]  PHYSICIAN:  Vesta Mixer, M.D. DATE OF BIRTH:  January 26, 1945  DATE OF ADMISSION:  06/10/2010 DATE OF DISCHARGE:                             HISTORY & PHYSICAL   PRIMARY CARDIOLOGIST:  [name], MD, [facility], [place].  PRIMARY PHYSICIAN:  [name], MD  REASON FOR ADMISSION:  [name] is a 65 year old female, very well- known to our group, with longstanding history of three-vessel coronary artery disease, status post multiple prior percutaneous interventions. She was last treated in July 2011, when she underwent successful placement of two drug-eluting stents to the right coronary artery, by Dr. Verne Carrow.  She had initially presented to [facility] with chest pain, ruled out for myocardial infarction with negative carotid markers, and transferred for diagnostic coronary angiography, secondary to worsening angina pectoris.  Specifically, the angiogram indicated triple-vessel CAD with severe stenosis of the distal RCA, successfully treated with two overlapping drug-eluting stents to the 80% distal stenosis.  Left ventricular function was normal (EF 65%). Residual anatomy notable for approximately 60% proximal LAD, 90% ostial AV/CFX stenosis (unchanged from prior study), and a small-caliber PDA with 99% stenosis, not amenable to percutaneous intervention.  Regarding previously placed stent, the distal LAD stent site was widely patent, as was the second OM, third OM, as well as diffuse stenting of the proximal/mid right coronary artery.  Clinically, the patient states that she felt much better after undergoing the procedure last year, and appeared to be doing well until the past 2-3 weeks.  She reportedly has become much more easily fatigued, short of breath with  exertion, and even reports occasional anterior chest pressure with exertion, but not always.  She denies any dyspnea or angina pectoris at rest.  With respect to her previous presentation, she states that this is fairly reminiscent, save for experiencing any left jaw or left arm pain, as she had last year.  She also reports having gained 10-15 pounds, just within the past few days. However, she denies any PND, or markedly progressive lower extremity edema.  She chronically sleeps in a recliner.  The patient initially presented to [facility] ED earlier today, and presented with a blood pressure of 168/79, pulse 72, and was afebrile.  She was treated with four baby aspirin, 80 mg IV Lasix, and placed on Nitropaste.  The patient is currently hemodynamically stable, pain free, and reports improvement in her level of dyspnea.  ALLERGIES:  SULFA.  HOME MEDICATIONS: 1. Lantus 42 units at bedtime. 2. Glyburide 6 mg daily. 3. Metformin 1000 mg daily. 4. Levothyroxine 0.15 mg daily. 5. Lasix 80 mg daily. 6. Diovan 320 mg daily. 7. Clonidine 0.3 mg b.i.d. 8. Amitriptyline 100 mg at bedtime. 9. Estradiol 2 mg daily. 10.Fish oil 3 g q.a.m./2 g at bedtime. 11.Carvedilol 25 mg b.i.d. 12.Crestor 5 mg daily/ 13.Niaspan 500 mg at bedtime. 14.Amlodipine 10 mg daily. 15.Coated aspirin 325 mg daily. 16.MiraLax 17 g daily. 17.Apidra 12 units p.r.n. 18.Labetalol 100  mg b.i.d. 19.Imdur 60 mg daily. 20.Potassium chloride 20 mEq daily. 21.Plavix 75 mg daily. 22.Relafen 750 mg b.i.d.  PAST MEDICAL HISTORY: 1. Multivessel coronary artery disease.     a.     Status post BMS CFX/RCA in 2001; BMS RCA 2003; PTCA      RCA/ramus 2004; DES RCA 2005; DES RCA 2007; DES OM/LAD 2009; and      DES RCA July 2011; EF 65%. 2. Normal left ventricular function. 3. Chronic kidney disease.     a.     Followed by Dr. Kristian Covey, in [place], [place]. 4. IDDM. 5. Hypertension. 6. Dyslipidemia. 7.  Hypothyroidism. 8. Fibromyalgia. 9. Morbid obesity.  SOCIAL HISTORY:  The patient is married.  She has one son.  She has never smoked tobacco.  FAMILY HISTORY:  Noncontributory for premature coronary artery disease.  REVIEW OF SYSTEMS:  As outlined per HPI.  Remaining system reviewed, and are negative.  PHYSICAL EXAMINATION:  VITAL SIGNS:  Blood pressure currently 164/76, pulse 58 and regular, respirations 22, temperature afebrile, sats 100% on 2 L, weight 224 pounds. GENERAL:  A 65 year old female, morbidly obese, lying supine, no apparent distress. HEENT:  Normocephalic, atraumatic.  PERRLA.  EOMI. NECK:  Palpable bilateral carotid pulses without bruits; unable to assess JVD, secondary to neck girth. LUNGS:  Diminished breath sounds, but without crackles or wheezes. HEART:  Regular rate and rhythm.  No significant murmurs. ABDOMEN:  Protuberant, intact bowel sounds. EXTREMITIES:  Palpable bilateral femoral pulses without bruits.  Brisk bilateral dorsalis pedis pulses.  Trace to 1+ edema. SKIN:  Warm and dry. MUSCULOSKELETAL:  No obvious deformity. NEUROLOGIC:  No focal deficit.  Admission chest x-ray:  Minimal bibasilar atelectasis.  Admission EKG: Normal sinus rhythm at 69 bpm; normal axis; nonspecific ST changes.  LABORATORY DATA:  CPK of 51/2.1, troponin-I less than 0.01.  Sodium 139, potassium 4.2, BUN 24, (GFR 31), with creatinine 1.2 in July 2011, glucose 226.  INR 1.0.  WBC 6.1, hemoglobin 12, medical 36, platelet 250.  IMPRESSION: 1. Unstable angina pectoris. 2. Multivessel coronary artery disease.     a.     Status post multiple prior percutaneous interventions.     b.     Status post drug-eluting stenting of RCA (2), July 2011. 3. Volume overload.     a.     History of normal left ventricular function. 4. Dyspnea on exertion. 5. Chronic kidney disease. 6. Insulin-dependent diabetes mellitus. 7. Hypertension. 8. Dyslipidemia. 9. Hypothyroidism.  PLAN:   Recommendation is to proceed with a repeat coronary angiogram and possible repeat percutaneous intervention.  The patient does present with progressive symptoms over the past several weeks, reminiscent of her prior presentation in July 2011.  However, she may require a few days of aggressive diuretic therapy prior to the procedure, followed by gentle hydration secondary to her underlying kidney dysfunction.  With respect to medications, she will be continued on home medication regimen, and maintained on Lasix 80 mg IV daily, with an additional dose to be given this evening.  She will need close monitoring of electrolytes and renal function, as well as strict I/Os and daily weights.  We will continue cycling cardiac markers, repeat an EKG in a.m., and check baseline BNP level.  A fasting lipid profile will be obtained for the morning.  Metformin will be placed on hold, and will be placed on sliding scale insulin.  A 2-D echocardiogram will be ordered for the morning, for reassessment of LV function and rule out  of underlying structural abnormalities.     [name] Serpe, PA-C   ______________________________ Vesta Mixer, M.D.   GS/MEDQ  D:  06/10/2010  T:  06/11/2010  Job:  [id number]  Electronically Signed by [name] PA-C on 06/15/2010 01:13:22 PM Electronically Signed by Kristeen Miss M.D. on 06/28/2010 09:15:31 AM

## 2010-07-13 ENCOUNTER — Ambulatory Visit (INDEPENDENT_AMBULATORY_CARE_PROVIDER_SITE_OTHER): Payer: Medicare Other | Admitting: Cardiology

## 2010-07-13 ENCOUNTER — Encounter: Payer: Self-pay | Admitting: Cardiology

## 2010-07-13 VITALS — BP 182/86 | HR 47 | Ht 62.0 in | Wt 216.8 lb

## 2010-07-13 DIAGNOSIS — I10 Essential (primary) hypertension: Secondary | ICD-10-CM

## 2010-07-13 DIAGNOSIS — E785 Hyperlipidemia, unspecified: Secondary | ICD-10-CM

## 2010-07-13 DIAGNOSIS — I2510 Atherosclerotic heart disease of native coronary artery without angina pectoris: Secondary | ICD-10-CM

## 2010-07-13 NOTE — Patient Instructions (Addendum)
   Stop Labetalol  Your physician wants you to follow up in:  3 months.  You will receive a reminder letter in the mail one-two months in advance.  If you don't receive a letter, please call our office to schedule the follow up appointment.

## 2010-07-13 NOTE — Assessment & Plan Note (Signed)
Continue statin therapy.

## 2010-07-13 NOTE — Assessment & Plan Note (Signed)
Continue present medical regimen with the exception of discontinuation of labetalol. Sodium restriction. Continue followup with Dr. Kristian Covey.

## 2010-07-13 NOTE — Progress Notes (Signed)
Clinical Summary [name] is a 65 y.o.female presenting for post hospital followup. I last saw her in February. Record review finds admission to [facility] back in June with diastolic heart failure exacerbation. She ruled out for myocardial infarction and did undergo cardiac catheterization, detailed below, with findings of patent stent sites and moderate disease within the proximal left anterior descending that was felt to be best managed medically at that point.  She reports doing generally well since discharge, stable shortness of breath, no reported weight gain. She does not endorse any angina symptoms. Medical regimen is extensive. We reviewed this today, noting duplication in beta blocker therapy. This was addressed.    Allergies  Allergen Reactions  . Sulfonamide Derivatives     REACTION: throat swelling    Current outpatient prescriptions:amitriptyline (ELAVIL) 10 MG tablet, Take 10 mg by mouth at bedtime. , Disp: , Rfl: ;  amitriptyline (ELAVIL) 100 MG tablet, Take 100 mg by mouth at bedtime.  , Disp: , Rfl: ;  amLODipine (NORVASC) 10 MG tablet, Take 10 mg by mouth daily.  , Disp: , Rfl: ;  aspirin 81 MG tablet, Take 81 mg by mouth daily.  , Disp: , Rfl: ;  carvedilol (COREG) 25 MG tablet, Take 25 mg by mouth 2 (two) times daily.  , Disp: , Rfl:  Cholecalciferol (VITAMIN D3) 5000 UNITS CAPS, Take 2 capsules by mouth daily.  , Disp: , Rfl: ;  cloNIDine (CATAPRES) 0.3 MG tablet, Take 0.3 mg by mouth 2 (two) times daily.  , Disp: , Rfl: ;  clopidogrel (PLAVIX) 75 MG tablet, Take 75 mg by mouth daily.  , Disp: , Rfl: ;  estradiol (ESTRACE) 2 MG tablet, Take 2 mg by mouth daily.  , Disp: , Rfl: ;  furosemide (LASIX) 80 MG tablet, Take 80 mg by mouth 2 (two) times daily. , Disp: , Rfl:  glyBURIDE micronized (GLYNASE) 6 MG tablet, Take 6 mg by mouth daily.  , Disp: , Rfl: ;  insulin glargine (LANTUS) 100 UNIT/ML injection, Inject 42 Units into the skin at bedtime.  , Disp: , Rfl: ;  insulin  glulisine (APIDRA) 100 UNIT/ML injection, Inject 12 Units into the skin as needed. If blood sugar >200 , Disp: , Rfl: ;  isosorbide mononitrate (IMDUR) 60 MG 24 hr tablet, Take 60 mg by mouth daily.  , Disp: , Rfl:  nabumetone (RELAFEN) 750 MG tablet, Take 750 mg by mouth 2 (two) times daily.  , Disp: , Rfl: ;  Omega-3 Fatty Acids (FISH OIL) 1000 MG CAPS, Take by mouth. Take 3 in am and 2 at bedtime , Disp: , Rfl: ;  polyethylene glycol powder (MIRALAX) powder, Take 17 g by mouth daily. In 8 oz of water or juice , Disp: , Rfl: ;  potassium chloride SA (K-DUR,KLOR-CON) 20 MEQ tablet, Take 20 mEq by mouth daily.  , Disp: , Rfl:  rosuvastatin (CRESTOR) 5 MG tablet, Take 5 mg by mouth daily.  , Disp: , Rfl: ;  valsartan (DIOVAN) 320 MG tablet, Take 320 mg by mouth daily.  , Disp: , Rfl: ;  vitamin C (ASCORBIC ACID) 500 MG tablet, Take 500 mg by mouth daily.  , Disp: , Rfl: ;  vitamin E 400 UNIT capsule, Take 400 Units by mouth daily.  , Disp: , Rfl: ;  DISCONTD: labetalol (NORMODYNE) 100 MG tablet, Take 100 mg by mouth 2 (two) times daily.  , Disp: , Rfl:  nitroGLYCERIN (NITROSTAT) 0.4 MG SL tablet, Place 0.4  mg under the tongue every 5 (five) minutes as needed.  , Disp: , Rfl: ;  DISCONTD: aspirin 325 MG EC tablet, Take 325 mg by mouth daily.  , Disp: , Rfl: ;  DISCONTD: ergocalciferol (VITAMIN D2) 50000 UNITS capsule, Take 50,000 Units by mouth once a week.  , Disp: , Rfl: ;  DISCONTD: metFORMIN (GLUMETZA) 1000 MG (MOD) 24 hr tablet, Take 1,000 mg by mouth daily.  , Disp: , Rfl:   Past Medical History  Diagnosis Date  . Fibromyalgia   . Hypothyroidism   . Diabetes mellitus, type 2   . Coronary atherosclerosis of native coronary artery     BMS circ/RCA 2001, BMS RCA 2003, PTCA RCA/ramus 2004, DES RCA 2005, DES RCA 2007, DES OM/LAD 2009, DES RCA 7/11, LVEF 65%  . Hyperlipidemia   . Essential hypertension, benign     Social History Ms. Herald reports that she has never smoked. She has never used  smokeless tobacco. Ms. Sinkler reports that she does not drink alcohol.  Review of Systems No palpitations or syncope. Otherwise negative.  Physical Examination Filed Vitals:   07/13/10 1449  BP: 182/86  Pulse: 36   GEN: 65 year old female, obese, sitting upright, in no distress  HEENT: NCAT,PERRLA,EOMI  NECK: palpable pulses, no bruits; no JVD; no TM  LUNGS: CTA bilaterally  HEART: RRR (S1S2); no significant murmurs; no rubs; no gallops  ABD: soft, NT; intact BS  EXT: 1+ bilateral lower extremity edema  SKIN: warm, dry  MUSC: no obvious deformity  NEURO: A/O (x3)   Studies Cardiac catheterization 06/12/2010:  ANGIOGRAPHIC FINDINGS:   1. The left main coronary artery had no evidence of disease.   2. The left anterior descending was large vessel that coursed to the       apex and gave off three small-caliber diagonal branches.  The first       two diagonal branches were very small in caliber and had mild       plaque disease.  The proximal left anterior descending artery had a       long segment of diffuse 60% narrowing.  Just at the septal       perforator seem to be the most significant portion of the stenosis.       This appeared to be approximately 65-70% stenosis, however, was       grossly unchanged from the catheterization last year.  There was a       stent present in the mid to distal vessel that was patent with no       restenosis.  There was mild diffuse plaque throughout the mid and       distal LAD otherwise.   3. Circumflex artery gave off a very small-caliber obtuse marginal       branch that was patent.  The second obtuse marginal branch had a       stent that was patent with no restenosis.  The mid circumflex had a       stent that was patent with no restenosis.  Just beyond the stent,       there was a bifurcation of the vessel comprising a third obtuse       marginal branch and a continuation of the AV groove circumflex.       There was a 90% stenosis in  the ostium of this inferior branch that       appeared unchanged from last year.  This is a small-caliber vessel.  4. The right coronary artery was a large dominant vessel with patent       stents throughout the proximal mid and distal vessel with minimal       in-stent restenosis.  The ostium of the posterolateral branch had       60% stenosis, but was small caliber vessel.  Posterior descending       artery is very small in caliber and had a 90% stenosis which was       unchanged from prior cath.   5. No left ventricular angiogram was performed.   Problem List and Plan

## 2010-07-13 NOTE — Assessment & Plan Note (Signed)
Relatively stable on medical therapy. Recent cardiac catheterization shows patent stent sites with moderate disease in the LAD that will be managed medically at this point. Office follow up arranged.

## 2010-07-24 ENCOUNTER — Telehealth: Payer: Self-pay | Admitting: *Deleted

## 2010-07-24 NOTE — Telephone Encounter (Signed)
Pt states Dr. [name] stopped labetalol b/c she was on this and Coreg. She thinks BP is now too high.   July 11 BP 184/81 HR 56 7/12 BP 178/83 HR 60 7/13 BP 189/88 HR 57 7/14 BP 188/83 HR 56 7/15 BP 177/90 HR 57 7/16 BP 189/88 HR 61 7/17 BP 184/82 HR 56  She states BP's were taken after lunch.   She is aware note will be sent to Dr. [name] for further review.

## 2010-07-24 NOTE — Telephone Encounter (Signed)
Pt left message on voicemail asking for return call. She states Dr. [name] stopped her labetalol and now her BP is running high and her HR is running low.

## 2010-07-26 MED ORDER — HYDRALAZINE HCL 25 MG PO TABS: 25.0000 mg | ORAL_TABLET | Freq: Three times a day (TID) | ORAL | Status: DC

## 2010-07-26 NOTE — Telephone Encounter (Signed)
Reviewed. Would still prefer that she not be on 2 different beta blockers with similar properties. Coreg dose was already 25 mg twice daily and she is fairly bradycardic. Consider starting hydralazine at 25 mg p.o. T.i.d. Follow blood pressure.

## 2010-07-26 NOTE — Telephone Encounter (Signed)
Pt notified and verbalized understanding.  RX sent to [facility] in [place] for hydralazine. Pt will monitor BP and notify of problems.

## 2010-10-11 LAB — BASIC METABOLIC PANEL
BUN: 14 mg/dL (ref 6–23)
CO2: 25 mEq/L (ref 19–32)
Chloride: 106 mEq/L (ref 96–112)
Glucose, Bld: 157 mg/dL — ABNORMAL HIGH (ref 70–99)
Potassium: 4 mEq/L (ref 3.5–5.1)
Sodium: 139 mEq/L (ref 135–145)

## 2010-10-11 LAB — GLUCOSE, CAPILLARY
Glucose-Capillary: 153 mg/dL — ABNORMAL HIGH (ref 70–99)
Glucose-Capillary: 195 mg/dL — ABNORMAL HIGH (ref 70–99)

## 2010-10-11 LAB — CBC
HCT: 38.1 % (ref 36.0–46.0)
Hemoglobin: 12.6 g/dL (ref 12.0–15.0)
MCHC: 32.9 g/dL (ref 30.0–36.0)
MCV: 94.4 fL (ref 78.0–100.0)
Platelets: 215 10*3/uL (ref 150–400)
RDW: 13.8 % (ref 11.5–15.5)

## 2010-10-11 LAB — CARDIAC PANEL(CRET KIN+CKTOT+MB+TROPI)
CK, MB: 3 ng/mL (ref 0.3–4.0)
CK, MB: 3.4 ng/mL (ref 0.3–4.0)
Relative Index: INVALID (ref 0.0–2.5)
Total CK: 38 U/L (ref 7–177)
Total CK: 50 U/L (ref 7–177)

## 2010-10-11 LAB — LIPID PANEL
Triglycerides: 155 mg/dL — ABNORMAL HIGH (ref <150)
VLDL: 31 mg/dL (ref 0–40)

## 2010-11-15 ENCOUNTER — Encounter: Payer: Self-pay | Admitting: Cardiology

## 2010-11-16 ENCOUNTER — Ambulatory Visit (INDEPENDENT_AMBULATORY_CARE_PROVIDER_SITE_OTHER): Payer: Medicare Other | Admitting: Physician Assistant

## 2010-11-16 ENCOUNTER — Encounter: Payer: Self-pay | Admitting: Cardiology

## 2010-11-16 DIAGNOSIS — R001 Bradycardia, unspecified: Secondary | ICD-10-CM | POA: Insufficient documentation

## 2010-11-16 DIAGNOSIS — E785 Hyperlipidemia, unspecified: Secondary | ICD-10-CM

## 2010-11-16 DIAGNOSIS — I5030 Unspecified diastolic (congestive) heart failure: Secondary | ICD-10-CM | POA: Insufficient documentation

## 2010-11-16 DIAGNOSIS — I498 Other specified cardiac arrhythmias: Secondary | ICD-10-CM

## 2010-11-16 DIAGNOSIS — I10 Essential (primary) hypertension: Secondary | ICD-10-CM

## 2010-11-16 DIAGNOSIS — I2510 Atherosclerotic heart disease of native coronary artery without angina pectoris: Secondary | ICD-10-CM

## 2010-11-16 NOTE — Assessment & Plan Note (Signed)
Well-controlled on current medication regimen, following recent discontinuation of labetalol.

## 2010-11-16 NOTE — Patient Instructions (Signed)
Continue all current medications. Your physician wants you to follow up in: 6 months.  You will receive a reminder letter in the mail one-two months in advance.  If you don't receive a letter, please call our office to schedule the follow up appointment   

## 2010-11-16 NOTE — Assessment & Plan Note (Signed)
Asymptomatic on carvedilol. 

## 2010-11-16 NOTE — Assessment & Plan Note (Addendum)
Fairly well-controlled lipids, with LDL 76 by recent study. Continue current regimen with low dose Crestor for now, but would recommend increasing to 10 mg daily, if she does not attain LDL target of 70, or less, following review of her next lipid profile.

## 2010-11-16 NOTE — Assessment & Plan Note (Signed)
Euvolemic by history and PE. In fact, patient has lost 8 pounds on her own, and weighs herself daily. She states that this increased maintenance dose of Lasix has worked very well for her, since her hospitalization for DHF, back in June.

## 2010-11-16 NOTE — Progress Notes (Signed)
Clinical Summary Ms. Burkel is a 65 y.o.female presenting for followup. She was seen in July.  Recent lab work from October showed hemoglobin 13.7, platelets 223, ALT 18, AST 19, BUN 22, potassium 4.0, LDL 76, HDL 43, triglycerides 359, cholesterol 191.  Clinically, patient continues to do very well, since her hospitalization for A/C DHF, back in June. She is on increased maintenance dose of Lasix at 80 twice a day. She has lost 8 pounds on her own, and weighs herself daily. She denies any symptoms of exertional SOB or CP, orthopnea, PND, or LE edema.  Allergies  Allergen Reactions  . Sulfonamide Derivatives     REACTION: throat swelling    Medication list reviewed.  Past Medical History  Diagnosis Date  . Fibromyalgia   . Hypothyroidism   . Diabetes mellitus, type 2   . Coronary atherosclerosis of native coronary artery     BMS circ/RCA 2001, BMS RCA 2003, PTCA RCA/ramus 2004, DES RCA 2005, DES RCA 2007, DES OM/LAD 2009, DES RCA 7/11, LVEF 65%  . Hyperlipidemia   . Essential hypertension, benign     Past Surgical History  Procedure Date  . Cholecystectomy   . Total abdominal hysterectomy   . Bladder suspension   . Neuroplasty / transposition median nerve at carpal tunnel bilateral     Family History  Problem Relation Age of Onset  . Coronary artery disease      Social History Ms. Casebolt reports that she has never smoked. She has never used smokeless tobacco. Ms. Paolella reports that she does not drink alcohol.  Review of Systems   Physical Examination There were no vitals filed for this visit.  GEN: 65 year old female, obese, sitting upright, in no distress  HEENT: NCAT,PERRLA,EOMI  NECK: palpable pulses, no bruits; unable to assess JVD, secondary to neck girth LUNGS: CTA bilaterally  HEART: RRR (S1S2); no significant murmurs; no rubs; no gallops  ABD: Protuberant  EXT: Trace bilateral lower extremity edema  SKIN: warm, dry  MUSC: no obvious deformity  NEURO:  A/O (x3)   ECG   Studies   Problem List and Plan

## 2010-11-16 NOTE — Assessment & Plan Note (Signed)
Quiescent and on current medication regimen.

## 2011-05-20 ENCOUNTER — Ambulatory Visit (INDEPENDENT_AMBULATORY_CARE_PROVIDER_SITE_OTHER): Payer: Medicare Other | Admitting: Cardiology

## 2011-05-20 ENCOUNTER — Encounter: Payer: Self-pay | Admitting: Cardiology

## 2011-05-20 VITALS — BP 153/81 | HR 54 | Ht 62.0 in | Wt 212.0 lb

## 2011-05-20 DIAGNOSIS — E785 Hyperlipidemia, unspecified: Secondary | ICD-10-CM

## 2011-05-20 DIAGNOSIS — I2510 Atherosclerotic heart disease of native coronary artery without angina pectoris: Secondary | ICD-10-CM

## 2011-05-20 DIAGNOSIS — I10 Essential (primary) hypertension: Secondary | ICD-10-CM

## 2011-05-20 NOTE — Assessment & Plan Note (Signed)
Blood pressure is elevated today. Reinforced diet and exercise.

## 2011-05-20 NOTE — Assessment & Plan Note (Signed)
No changes made to current dose of Crestor. She had significant hypertriglyceridemia by recent blood work, although in the setting of uncontrolled diabetes. She is working on this with Dr. Charm Barges.

## 2011-05-20 NOTE — Progress Notes (Signed)
Clinical Summary [name] is a 66 y.o.female presenting for followup. She was seen in November 2012. She reports no angina or unusual shortness of breath. Has been doing some walking outdoors around her home. Does state that her blood sugar has been difficult to control, had some steroid injections in the lumbar spine in the interim. She is working on this with [name].  Recent lab work reviewed including ALT 36, AST 29, BUN 31, potassium 5.4, sodium 141, hemoglobin A1c 11.8%, triglycerides 524, cholesterol 210, HDL 40, LDL not calculated, TSH 2.7.  We reviewed her cardiac medications. She reports compliance. No reported bleeding problems on DAPT.  Allergies  Allergen Reactions  . Sulfonamide Derivatives     REACTION: throat swelling    Current Outpatient Prescriptions  Medication Sig Dispense Refill  . amitriptyline (ELAVIL) 10 MG tablet Take 10 mg by mouth at bedtime.       Marland Kitchen amitriptyline (ELAVIL) 100 MG tablet Take 100 mg by mouth at bedtime.        Marland Kitchen amLODipine (NORVASC) 10 MG tablet Take 10 mg by mouth daily.        Marland Kitchen aspirin 81 MG tablet Take 81 mg by mouth daily.        . carvedilol (COREG) 25 MG tablet Take 25 mg by mouth 2 (two) times daily.        . Cholecalciferol (VITAMIN D3) 5000 UNITS CAPS Take 2 capsules by mouth daily.        . cloNIDine (CATAPRES) 0.3 MG tablet Take 0.3 mg by mouth 2 (two) times daily.        . clopidogrel (PLAVIX) 75 MG tablet Take 75 mg by mouth daily.        . furosemide (LASIX) 80 MG tablet Take 80 mg by mouth 2 (two) times daily.       Marland Kitchen glyBURIDE micronized (GLYNASE) 6 MG tablet Take 6 mg by mouth daily.        . hydrALAZINE (APRESOLINE) 25 MG tablet Take 1 tablet (25 mg total) by mouth 3 (three) times daily.  90 tablet  6  . HYDROcodone-acetaminophen (LORTAB) 7.5-500 MG per tablet Take 1 tablet by mouth Every 6 hours as needed.      . insulin glargine (LANTUS) 100 UNIT/ML injection Inject 41 Units into the skin at bedtime.       . insulin  glulisine (APIDRA) 100 UNIT/ML injection Inject 7 Units into the skin as needed. If blood sugar >200      . isosorbide mononitrate (IMDUR) 60 MG 24 hr tablet Take 60 mg by mouth daily.        . nabumetone (RELAFEN) 750 MG tablet Take 750 mg by mouth 2 (two) times daily.        . nitroGLYCERIN (NITROSTAT) 0.4 MG SL tablet Place 0.4 mg under the tongue every 5 (five) minutes as needed.        . Omega-3 Fatty Acids (FISH OIL) 1000 MG CAPS Take by mouth. Take 3 in am and 2 at bedtime       . polyethylene glycol powder (MIRALAX) powder Take 17 g by mouth daily. In 8 oz of water or juice       . potassium chloride SA (K-DUR,KLOR-CON) 20 MEQ tablet Take 20 mEq by mouth daily.        . rosuvastatin (CRESTOR) 5 MG tablet Take 5 mg by mouth daily.        . valsartan (DIOVAN) 320 MG tablet Take 320  mg by mouth daily.        . vitamin C (ASCORBIC ACID) 500 MG tablet Take 500 mg by mouth daily.        . vitamin E 400 UNIT capsule Take 400 Units by mouth daily.          Past Medical History  Diagnosis Date  . Fibromyalgia   . Hypothyroidism   . Diabetes mellitus, type 2   . Coronary atherosclerosis of native coronary artery     BMS circ/RCA 2001, BMS RCA 2003, PTCA RCA/ramus 2004, DES RCA 2005, DES RCA 2007, DES OM/LAD 2009, DES RCA 7/11, LVEF 65%  . Hyperlipidemia   . Essential hypertension, benign     Social History [name] reports that she has never smoked. She has never used smokeless tobacco. [name] reports that she does not drink alcohol.  Review of Systems No palpitations. No orthopnea. Stable appetite. Otherwise negative.  Physical Examination Filed Vitals:   05/20/11 1526  BP: 153/81  Pulse: 54   Obese woman in no acute distress. HEENT: Conjunctiva and lids normal, oropharynx clear with moist mucosa. Neck: Supple, no elevated JVP or carotid bruits, no thyromegaly. Lungs: Clear to auscultation, nonlabored breathing at rest. Cardiac: Regular rate and rhythm, no S3, soft  systolic murmur, no pericardial rub. Abdomen: Soft, protuberant, nontender, bowel sounds present, no guarding or rebound. Extremities: Trace edema, distal pulses 1+.   ECG Reviewed in EMR.  Problem List and Plan   CAD, NATIVE VESSEL Clinically stable without active angina or progressive breathlessness. We discussed continuing her walking regimen, observation on medical therapy. Followup arranged.  HYPERLIPIDEMIA-MIXED No changes made to current dose of Crestor. She had significant hypertriglyceridemia by recent blood work, although in the setting of uncontrolled diabetes. She is working on this with [name].  Essential hypertension, benign Blood pressure is elevated today. Reinforced diet and exercise.     [name], M.D., F.A.C.C.

## 2011-05-20 NOTE — Assessment & Plan Note (Signed)
Clinically stable without active angina or progressive breathlessness. We discussed continuing her walking regimen, observation on medical therapy. Followup arranged.

## 2011-05-20 NOTE — Patient Instructions (Signed)
Continue all current medications. Your physician wants you to follow up in: 6 months.  You will receive a reminder letter in the mail one-two months in advance.  If you don't receive a letter, please call our office to schedule the follow up appointment   

## 2011-07-16 ENCOUNTER — Other Ambulatory Visit: Payer: Self-pay

## 2011-12-20 ENCOUNTER — Encounter: Payer: Self-pay | Admitting: Cardiology

## 2011-12-20 ENCOUNTER — Ambulatory Visit (INDEPENDENT_AMBULATORY_CARE_PROVIDER_SITE_OTHER): Payer: Medicare Other | Admitting: Cardiology

## 2011-12-20 VITALS — BP 126/76 | HR 67 | Ht 62.0 in | Wt 206.0 lb

## 2011-12-20 DIAGNOSIS — I10 Essential (primary) hypertension: Secondary | ICD-10-CM

## 2011-12-20 DIAGNOSIS — I2510 Atherosclerotic heart disease of native coronary artery without angina pectoris: Secondary | ICD-10-CM

## 2011-12-20 DIAGNOSIS — E785 Hyperlipidemia, unspecified: Secondary | ICD-10-CM

## 2011-12-20 NOTE — Assessment & Plan Note (Signed)
Keep followup lab work with Dr. Charm Barges. She is tolerating low-dose Crestor.

## 2011-12-20 NOTE — Patient Instructions (Addendum)

## 2011-12-20 NOTE — Assessment & Plan Note (Signed)
Blood pressure control looks good today. No changes made. 

## 2011-12-20 NOTE — Progress Notes (Signed)
Clinical Summary [name] is a 66 y.o.female presenting for followup. She was seen in May. She continues to do fairly well, denies any significant angina symptoms or progressive shortness of breath. She reports compliance with her medications.  Followup lab work in July showed AST 19, ALT 24, BUN 26, creatinine 1.6, potassium 4.5, hemoglobin A1c 9.6%, LDL 108, HDL 42, cholesterol 193. Can we discussed these today.  Her last percutaneous intervention was in July 2011. We continue to follow her medically at this point in the absence of progressive symptoms. She has had multiple interventions over time.   Allergies  Allergen Reactions  . Sulfonamide Derivatives     REACTION: throat swelling    Current Outpatient Prescriptions  Medication Sig Dispense Refill  . amitriptyline (ELAVIL) 10 MG tablet Take 10 mg by mouth at bedtime.       Marland Kitchen amitriptyline (ELAVIL) 100 MG tablet Take 100 mg by mouth at bedtime.        Marland Kitchen amLODipine (NORVASC) 10 MG tablet Take 10 mg by mouth daily.        Marland Kitchen aspirin 81 MG tablet Take 81 mg by mouth daily.        . carvedilol (COREG) 25 MG tablet Take 25 mg by mouth 2 (two) times daily.        . Cholecalciferol (VITAMIN D3) 5000 UNITS CAPS Take 2 capsules by mouth daily.        . cloNIDine (CATAPRES) 0.3 MG tablet Take 0.3 mg by mouth 2 (two) times daily.        . clopidogrel (PLAVIX) 75 MG tablet Take 75 mg by mouth daily.        . furosemide (LASIX) 80 MG tablet Take 80 mg by mouth 2 (two) times daily.       Marland Kitchen glyBURIDE micronized (GLYNASE) 6 MG tablet Take 6 mg by mouth 2 (two) times daily with a meal.       . hydrALAZINE (APRESOLINE) 25 MG tablet Take 1 tablet (25 mg total) by mouth 3 (three) times daily.  90 tablet  6  . HYDROcodone-acetaminophen (LORTAB) 7.5-500 MG per tablet Take 1 tablet by mouth Every 6 hours as needed.      . insulin glargine (LANTUS) 100 UNIT/ML injection Inject 41 Units into the skin at bedtime.       . insulin glulisine (APIDRA) 100  UNIT/ML injection Inject 7 Units into the skin as needed. If blood sugar >200      . isosorbide mononitrate (IMDUR) 60 MG 24 hr tablet Take 60 mg by mouth daily.        . nabumetone (RELAFEN) 750 MG tablet Take 750 mg by mouth 2 (two) times daily.        . nitroGLYCERIN (NITROSTAT) 0.4 MG SL tablet Place 0.4 mg under the tongue every 5 (five) minutes as needed.        . Omega-3 Fatty Acids (FISH OIL) 1000 MG CAPS Take by mouth. Take 3 in am and 2 at bedtime       . polyethylene glycol powder (MIRALAX) powder Take 17 g by mouth daily. In 8 oz of water or juice       . potassium chloride SA (K-DUR,KLOR-CON) 20 MEQ tablet Take 20 mEq by mouth daily.        . rosuvastatin (CRESTOR) 5 MG tablet Take 5 mg by mouth daily.        . valsartan (DIOVAN) 320 MG tablet Take 320 mg by mouth daily.        Marland Kitchen  vitamin C (ASCORBIC ACID) 500 MG tablet Take 500 mg by mouth daily.        . vitamin E 400 UNIT capsule Take 400 Units by mouth daily.          Past Medical History  Diagnosis Date  . Fibromyalgia   . Hypothyroidism   . Diabetes mellitus, type 2   . Coronary atherosclerosis of native coronary artery     BMS circ/RCA 2001, BMS RCA 2003, PTCA RCA/ramus 2004, DES RCA 2005, DES RCA 2007, DES OM/LAD 2009, DES RCA 7/11, LVEF 65%  . Hyperlipidemia   . Essential hypertension, benign     Social History [name] reports that she has never smoked. She has never used smokeless tobacco. [name] reports that she does not drink alcohol.  Review of Systems No palpitations, no reported bleeding problems. No claudication. Stable appetite. Otherwise negative.  Physical Examination Filed Vitals:   12/20/11 1348  BP: 126/76  Pulse: 67   Filed Weights   12/20/11 1348  Weight: 206 lb (93.441 kg)    Obese woman in no acute distress.  HEENT: Conjunctiva and lids normal, oropharynx clear with moist mucosa.  Neck: Supple, no elevated JVP or carotid bruits, no thyromegaly.  Lungs: Clear to auscultation,  nonlabored breathing at rest.  Cardiac: Regular rate and rhythm, no S3, soft systolic murmur, no pericardial rub.  Abdomen: Soft, protuberant, nontender, bowel sounds present, no guarding or rebound.  Extremities: Trace edema, distal pulses 1+.   Problem List and Plan   CAD, NATIVE VESSEL Continue medical therapy and observation for now. We have discussed warning signs and symptoms. Routine followup arranged.  Essential hypertension, benign Blood pressure control looks good today. No changes made.  HYPERLIPIDEMIA-MIXED Keep followup lab work with Dr. Charm Barges. She is tolerating low-dose Crestor.    [name], M.D., F.A.C.C.

## 2011-12-20 NOTE — Assessment & Plan Note (Signed)
Continue medical therapy and observation for now. We have discussed warning signs and symptoms. Routine followup arranged.

## 2012-06-19 ENCOUNTER — Encounter: Payer: Self-pay | Admitting: Cardiology

## 2012-06-19 ENCOUNTER — Ambulatory Visit (INDEPENDENT_AMBULATORY_CARE_PROVIDER_SITE_OTHER): Payer: Medicare Other | Admitting: Cardiology

## 2012-06-19 VITALS — BP 111/68 | HR 61 | Ht 62.0 in | Wt 192.0 lb

## 2012-06-19 DIAGNOSIS — I2510 Atherosclerotic heart disease of native coronary artery without angina pectoris: Secondary | ICD-10-CM

## 2012-06-19 DIAGNOSIS — E785 Hyperlipidemia, unspecified: Secondary | ICD-10-CM

## 2012-06-19 DIAGNOSIS — I10 Essential (primary) hypertension: Secondary | ICD-10-CM

## 2012-06-19 NOTE — Patient Instructions (Addendum)

## 2012-06-19 NOTE — Assessment & Plan Note (Signed)
Symptomatically stable on medical therapy, status post multiple interventions as outlined above. ECG is stable. Continue observation for now.

## 2012-06-19 NOTE — Assessment & Plan Note (Signed)
Lipids have been well controlled on Crestor. 

## 2012-06-19 NOTE — Progress Notes (Signed)
Clinical Summary [name] is a 67 y.o.female last seen in December 2013. She has been doing well from cardiac perspective, no angina or progressive shortness of breath. Her ECG today shows sinus rhythm with low-voltage, decreased R wave progression, prolonged PR interval 220 ms, nonspecific T-wave changes.  Lab work from April reviewed finding ALT 26, AST 18, BUN 36, creatinine 1.9, potassium 4.5, hemoglobin A1c 8.8, cholesterol 135, triglycerides 187, HDL 37, LDL 61.  She reports compliance with her cardiac medications.  Allergies  Allergen Reactions  . Sulfonamide Derivatives     REACTION: throat swelling    Current Outpatient Prescriptions  Medication Sig Dispense Refill  . amitriptyline (ELAVIL) 10 MG tablet Take 10 mg by mouth at bedtime.       Marland Kitchen amitriptyline (ELAVIL) 100 MG tablet Take 100 mg by mouth at bedtime.        Marland Kitchen amLODipine (NORVASC) 10 MG tablet Take 10 mg by mouth daily.        Marland Kitchen aspirin 81 MG tablet Take 81 mg by mouth daily.        . carvedilol (COREG) 25 MG tablet Take 25 mg by mouth 2 (two) times daily.        . Cholecalciferol (VITAMIN D3) 5000 UNITS CAPS Take 2 capsules by mouth daily.        . cloNIDine (CATAPRES) 0.3 MG tablet Take 0.3 mg by mouth 2 (two) times daily.        . clopidogrel (PLAVIX) 75 MG tablet Take 75 mg by mouth daily.        . furosemide (LASIX) 80 MG tablet Take 80 mg by mouth 2 (two) times daily.       Marland Kitchen glyBURIDE micronized (GLYNASE) 6 MG tablet Take 6 mg by mouth 2 (two) times daily with a meal.       . hydrALAZINE (APRESOLINE) 25 MG tablet Take 25 mg by mouth 3 (three) times daily.      Marland Kitchen HYDROcodone-acetaminophen (LORTAB) 7.5-500 MG per tablet Take 1 tablet by mouth Every 6 hours as needed.      . insulin glargine (LANTUS) 100 UNIT/ML injection Inject 41 Units into the skin at bedtime.       . insulin glulisine (APIDRA) 100 UNIT/ML injection Inject 7 Units into the skin as needed. If blood sugar >200      . isosorbide mononitrate  (IMDUR) 60 MG 24 hr tablet Take 60 mg by mouth daily.        . nabumetone (RELAFEN) 750 MG tablet Take 750 mg by mouth 2 (two) times daily.        . nitroGLYCERIN (NITROSTAT) 0.4 MG SL tablet Place 0.4 mg under the tongue every 5 (five) minutes as needed.        . Omega-3 Fatty Acids (FISH OIL) 1000 MG CAPS Take by mouth. Take 3 in am and 2 at bedtime       . polyethylene glycol powder (MIRALAX) powder Take 17 g by mouth daily. In 8 oz of water or juice       . potassium chloride SA (K-DUR,KLOR-CON) 20 MEQ tablet Take 20 mEq by mouth daily.        . rosuvastatin (CRESTOR) 5 MG tablet Take 5 mg by mouth daily.        . valsartan (DIOVAN) 320 MG tablet Take 320 mg by mouth daily.        . vitamin C (ASCORBIC ACID) 500 MG tablet Take 500 mg by mouth daily.        Marland Kitchen  vitamin E 400 UNIT capsule Take 400 Units by mouth daily.         No current facility-administered medications for this visit.    Past Medical History  Diagnosis Date  . Fibromyalgia   . Hypothyroidism   . Diabetes mellitus, type 2   . Coronary atherosclerosis of native coronary artery     BMS circ/RCA 2001, BMS RCA 2003, PTCA RCA/ramus 2004, DES RCA 2005, DES RCA 2007, DES OM/LAD 2009, DES RCA 7/11, LVEF 65%  . Hyperlipidemia   . Essential hypertension, benign     Social History [name] reports that she has never smoked. She has never used smokeless tobacco. [name] reports that she does not drink alcohol.  Review of Systems Chronic back pain. Has been getting injections. Is interested in seeing a spine specialist. Otherwise negative.  Physical Examination Filed Vitals:   06/19/12 1444  BP: 111/68  Pulse: 61   Filed Weights   06/19/12 1444  Weight: 192 lb (87.091 kg)    Obese woman, comfortable at rest. HEENT: Conjunctiva and lids normal, oropharynx clear with moist mucosa.  Neck: Supple, no elevated JVP or carotid bruits, no thyromegaly.  Lungs: Clear to auscultation, nonlabored breathing at rest.    Cardiac: Regular rate and rhythm, no S3, soft systolic murmur, no pericardial rub.  Abdomen: Soft, protuberant, nontender, bowel sounds present, no guarding or rebound.  Extremities: Trace edema, distal pulses 1+.   Problem List and Plan   CAD, NATIVE VESSEL Symptomatically stable on medical therapy, status post multiple interventions as outlined above. ECG is stable. Continue observation for now.  Essential hypertension, benign Blood pressure is normal today.  HYPERLIPIDEMIA-MIXED Lipids have been well controlled on Crestor.    [name], M.D., F.A.C.C.

## 2012-06-19 NOTE — Assessment & Plan Note (Signed)
Blood pressure is normal today. 

## 2012-12-14 ENCOUNTER — Emergency Department (HOSPITAL_COMMUNITY)
Admission: EM | Admit: 2012-12-14 | Discharge: 2012-12-14 | Disposition: A | Discharge status: Home / Self Care | Payer: Medicare Other | Attending: Emergency Medicine | Admitting: Emergency Medicine

## 2012-12-14 ENCOUNTER — Emergency Department (HOSPITAL_COMMUNITY): Payer: Medicare Other

## 2012-12-14 ENCOUNTER — Telehealth: Payer: Self-pay | Admitting: *Deleted

## 2012-12-14 ENCOUNTER — Encounter (HOSPITAL_COMMUNITY): Payer: Self-pay | Admitting: Emergency Medicine

## 2012-12-14 DIAGNOSIS — I10 Essential (primary) hypertension: Secondary | ICD-10-CM | POA: Insufficient documentation

## 2012-12-14 DIAGNOSIS — IMO0001 Reserved for inherently not codable concepts without codable children: Secondary | ICD-10-CM | POA: Insufficient documentation

## 2012-12-14 DIAGNOSIS — E119 Type 2 diabetes mellitus without complications: Secondary | ICD-10-CM | POA: Insufficient documentation

## 2012-12-14 DIAGNOSIS — Z794 Long term (current) use of insulin: Secondary | ICD-10-CM | POA: Insufficient documentation

## 2012-12-14 DIAGNOSIS — Z7982 Long term (current) use of aspirin: Secondary | ICD-10-CM | POA: Insufficient documentation

## 2012-12-14 DIAGNOSIS — Z79899 Other long term (current) drug therapy: Secondary | ICD-10-CM | POA: Insufficient documentation

## 2012-12-14 DIAGNOSIS — E785 Hyperlipidemia, unspecified: Secondary | ICD-10-CM | POA: Insufficient documentation

## 2012-12-14 DIAGNOSIS — Z7902 Long term (current) use of antithrombotics/antiplatelets: Secondary | ICD-10-CM | POA: Insufficient documentation

## 2012-12-14 DIAGNOSIS — R110 Nausea: Secondary | ICD-10-CM | POA: Insufficient documentation

## 2012-12-14 DIAGNOSIS — Z9861 Coronary angioplasty status: Secondary | ICD-10-CM | POA: Insufficient documentation

## 2012-12-14 DIAGNOSIS — N289 Disorder of kidney and ureter, unspecified: Secondary | ICD-10-CM

## 2012-12-14 DIAGNOSIS — I2510 Atherosclerotic heart disease of native coronary artery without angina pectoris: Secondary | ICD-10-CM | POA: Insufficient documentation

## 2012-12-14 DIAGNOSIS — E039 Hypothyroidism, unspecified: Secondary | ICD-10-CM | POA: Insufficient documentation

## 2012-12-14 DIAGNOSIS — R079 Chest pain, unspecified: Secondary | ICD-10-CM | POA: Insufficient documentation

## 2012-12-14 LAB — CBC
MCV: 95.1 fL (ref 78.0–100.0)
Platelets: 234 10*3/uL (ref 150–400)
RBC: 3.84 MIL/uL — ABNORMAL LOW (ref 3.87–5.11)
RDW: 12.7 % (ref 11.5–15.5)
WBC: 4.8 10*3/uL (ref 4.0–10.5)

## 2012-12-14 LAB — BASIC METABOLIC PANEL
CO2: 29 mEq/L (ref 19–32)
Calcium: 9.5 mg/dL (ref 8.4–10.5)
Chloride: 101 mEq/L (ref 96–112)
Creatinine, Ser: 2.32 mg/dL — ABNORMAL HIGH (ref 0.50–1.10)
GFR calc Af Amer: 24 mL/min — ABNORMAL LOW (ref >90)
Sodium: 141 mEq/L (ref 135–145)

## 2012-12-14 LAB — TROPONIN I
Troponin I: <0.3 ng/mL (ref <0.30)
Troponin I: <0.3 ng/mL (ref <0.30)

## 2012-12-14 LAB — PRO B NATRIURETIC PEPTIDE: Pro B Natriuretic peptide (BNP): 945.9 pg/mL — ABNORMAL HIGH (ref 0–125)

## 2012-12-14 LAB — PROTIME-INR: INR: 0.94 (ref 0.00–1.49)

## 2012-12-14 MED ORDER — TRAMADOL HCL 50 MG PO TABS: 50.0000 mg | ORAL_TABLET | Freq: Four times a day (QID) | ORAL | Status: DC | PRN

## 2012-12-14 MED ORDER — SODIUM CHLORIDE 0.9 % IV SOLN
INTRAVENOUS | Status: DC
  Administered 2012-12-14: 15:00:00 via INTRAVENOUS

## 2012-12-14 MED ORDER — SODIUM CHLORIDE 0.9 % IV BOLUS (SEPSIS)
500.0000 mL | Freq: Once | INTRAVENOUS | Status: AC
  Administered 2012-12-14: 500 mL via INTRAVENOUS

## 2012-12-14 NOTE — Telephone Encounter (Signed)
Patient started having chest pain on Friday. Patient says the chest pain has increased and is radiating to her left arm. Patient says the pain is rated #7 on a scale of 1-10 (10 being the greatest). Patient took her nitroglycerin and got some relief. Nurse advised husband to take patient to [facility] now for an evaluation. Patient's husband verbalized understanding of plan.

## 2012-12-14 NOTE — ED Notes (Signed)
Pt c/o centralized chest pain that radiates to left shoulder and neck. Pt also reports nausea, SOB, and lightheadedness. Symptoms have been intermittent since Friday. Pt describes pain as throbbing. Pt has significant cardiac hx including 13 stents since 2001.

## 2012-12-14 NOTE — ED Provider Notes (Signed)
CSN: [id number]     Arrival date & time 12/14/12  1045 History   First MD Initiated Contact with Patient 12/14/12 1112     This chart was scribed for [name] Jakes, MD by Ellin Mayhew, ED Scribe. This patient was seen in room APA11/[facility]11 and the patient's care was started at 11:36 AM.  Chief Complaint  Patient presents with  . Chest Pain   (Consider location/radiation/quality/duration/timing/severity/associated sxs/prior Treatment) Patient is a 67 y.o. female presenting with chest pain. The history is provided by the patient. No language interpreter was used.  Chest Pain Pain location:  R chest Pain radiates to:  Does not radiate Pain radiates to the back: no   Pain severity:  No pain Onset quality:  Gradual Duration:  3 days Timing:  Intermittent Progression:  Resolved Associated symptoms: nausea and shortness of breath   Associated symptoms: no abdominal pain, no back pain, no cough, no fever, no headache and not vomiting     HPI Comments: [name] is a 67 y.o. female who presents to the Emergency Department complaining of left arm and right upper chest pain with associated SOB that began three days ago. The pain has been intermittent and was worse at 8:00AM this morning for approximately 5 minutes. The pain is made worse with exertion. She has taken NTG with relief and is currently pain free. She has had nausea without emesis last night.  She has had pain like this in the past while she had a Stent. Her last Stent was put in four to five years ago. Pt has a history of MI and states this pain is different. Patient takes one Aspirin and Plavix daily.   PCP is Dr. Charm Barges in [place].  Cardiologist is Dr. [name] at [facility]  Past Medical History  Diagnosis Date  . Fibromyalgia   . Hypothyroidism   . Diabetes mellitus, type 2   . Coronary atherosclerosis of native coronary artery     BMS circ/RCA 2001, BMS RCA 2003, PTCA RCA/ramus 2004, DES RCA 2005, DES RCA 2007,  DES OM/LAD 2009, DES RCA 7/11, LVEF 65%  . Hyperlipidemia   . Essential hypertension, benign    Past Surgical History  Procedure Laterality Date  . Cholecystectomy    . Total abdominal hysterectomy    . Bladder suspension    . Neuroplasty / transposition median nerve at carpal tunnel bilateral     Family History  Problem Relation Age of Onset  . Coronary artery disease     History  Substance Use Topics  . Smoking status: Never Smoker   . Smokeless tobacco: Never Used     Comment: tobacco use - no  . Alcohol Use: No   OB History   Grav Para Term Preterm Abortions TAB SAB Ect Mult Living                 Review of Systems  Constitutional: Negative for fever and chills.  HENT: Negative for congestion and sore throat.   Eyes: Negative for visual disturbance.  Respiratory: Positive for shortness of breath. Negative for cough.   Cardiovascular: Positive for chest pain. Negative for leg swelling.  Gastrointestinal: Positive for nausea. Negative for vomiting, abdominal pain and diarrhea.  Genitourinary: Negative for dysuria.  Musculoskeletal: Negative for back pain and neck pain.       Arm pain.  Skin: Negative for rash.  Neurological: Negative for headaches.  Hematological: Does not bruise/bleed easily.  Psychiatric/Behavioral: Negative for confusion.    Allergies  Sulfonamide derivatives  Home Medications   Current Outpatient Rx  Name  Route  Sig  Dispense  Refill  . amitriptyline (ELAVIL) 100 MG tablet   Oral   Take 100 mg by mouth at bedtime.           Marland Kitchen amLODipine (NORVASC) 10 MG tablet   Oral   Take 10 mg by mouth daily.           Marland Kitchen aspirin 81 MG tablet   Oral   Take 81 mg by mouth daily.           . carvedilol (COREG) 25 MG tablet   Oral   Take 25 mg by mouth 2 (two) times daily.           . cholecalciferol (VITAMIN D) 1000 UNITS tablet   Oral   Take 2,000 Units by mouth daily.         . cloNIDine (CATAPRES) 0.3 MG tablet   Oral   Take  0.3 mg by mouth 2 (two) times daily.           . clopidogrel (PLAVIX) 75 MG tablet   Oral   Take 75 mg by mouth daily.           . furosemide (LASIX) 80 MG tablet   Oral   Take 80 mg by mouth 2 (two) times daily.          Marland Kitchen glyBURIDE micronized (GLYNASE) 6 MG tablet   Oral   Take 6 mg by mouth daily.          . hydrALAZINE (APRESOLINE) 25 MG tablet   Oral   Take 25 mg by mouth 3 (three) times daily.         . insulin glargine (LANTUS) 100 UNIT/ML injection   Subcutaneous   Inject 50 Units into the skin 2 (two) times daily.          . insulin glulisine (APIDRA) 100 UNIT/ML injection   Subcutaneous   Inject 15 Units into the skin 3 (three) times daily with meals.          . isosorbide mononitrate (IMDUR) 60 MG 24 hr tablet   Oral   Take 60 mg by mouth daily.           Marland Kitchen levothyroxine (SYNTHROID, LEVOTHROID) 150 MCG tablet   Oral   Take 150 mcg by mouth daily before breakfast.         . nabumetone (RELAFEN) 750 MG tablet   Oral   Take 750 mg by mouth 2 (two) times daily.           . nitroGLYCERIN (NITROSTAT) 0.4 MG SL tablet   Sublingual   Place 0.4 mg under the tongue every 5 (five) minutes as needed.           . Omega-3 Fatty Acids (FISH OIL) 1000 MG CAPS   Oral   Take by mouth. Take 3 in am and 2 at bedtime          . polyethylene glycol powder (MIRALAX) powder   Oral   Take 17 g by mouth daily as needed for mild constipation. In 8 oz of water or juice         . potassium chloride SA (K-DUR,KLOR-CON) 20 MEQ tablet   Oral   Take 20 mEq by mouth daily.           . simvastatin (ZOCOR) 40 MG tablet   Oral   Take 40 mg  by mouth daily.         . sitaGLIPtin-metformin (JANUMET) 50-500 MG per tablet   Oral   Take 1 tablet by mouth 2 (two) times daily with a meal.         . valsartan (DIOVAN) 320 MG tablet   Oral   Take 320 mg by mouth daily.           . vitamin C (ASCORBIC ACID) 500 MG tablet   Oral   Take 500 mg by mouth  daily.           . vitamin E 400 UNIT capsule   Oral   Take 400 Units by mouth daily.           . traMADol (ULTRAM) 50 MG tablet   Oral   Take 1 tablet (50 mg total) by mouth every 6 (six) hours as needed.   20 tablet   0    Triage Vitals: BP 172/79  Pulse 58  Temp(Src) 98 F (36.7 C)  Resp 20  Ht 5\' 2"  (1.575 m)  Wt 180 lb (81.647 kg)  BMI 32.91 kg/m2  SpO2 98% Physical Exam  Nursing note and vitals reviewed. Constitutional: She is oriented to person, place, and time. She appears well-developed and well-nourished. No distress.  HENT:  Head: Normocephalic and atraumatic.  Mouth/Throat: Oropharynx is clear and moist.  Eyes: Conjunctivae and EOM are normal. Pupils are equal, round, and reactive to light.  Sclera are clear  Neck: Neck supple. No tracheal deviation present.  Cardiovascular: Normal rate and regular rhythm.   No murmur heard. Pulses:      Dorsalis pedis pulses are 2+ on the right side, and 2+ on the left side.  Pulmonary/Chest: Effort normal and breath sounds normal. No respiratory distress. She has no wheezes.  Abdominal: Soft. Bowel sounds are normal. She exhibits no distension. There is no tenderness.  Musculoskeletal: Normal range of motion. She exhibits no edema (no ankle swelling).  Lymphadenopathy:    She has no cervical adenopathy.  Neurological: She is alert and oriented to person, place, and time. No cranial nerve deficit.  Pt able to move both sets of fingers and toes  Skin: Skin is warm and dry. No rash noted.  Psychiatric: She has a normal mood and affect. Her behavior is normal.    ED Course  Procedures (including critical care time)  DIAGNOSTIC STUDIES: Oxygen Saturation is 98% on room air, normal by my interpretation.    COORDINATION OF CARE:  11:41 AM-Ordered CXR, CBC, BNP. Discussed treatment plans with patient at bedside and patient agrees.      Labs Review Labs Reviewed  CBC - Abnormal; Notable for the following:    RBC  3.84 (*)    All other components within normal limits  BASIC METABOLIC PANEL - Abnormal; Notable for the following:    Glucose, Bld 156 (*)    BUN 32 (*)    Creatinine, Ser 2.32 (*)    GFR calc non Af Amer 21 (*)    GFR calc Af Amer 24 (*)    All other components within normal limits  PRO B NATRIURETIC PEPTIDE - Abnormal; Notable for the following:    Pro B Natriuretic peptide (BNP) 945.9 (*)    All other components within normal limits  TROPONIN I  PROTIME-INR  TROPONIN I   Results for orders placed during the hospital encounter of 12/14/12  CBC      Result Value Range   WBC 4.8  4.0 - 10.5 K/uL   RBC 3.84 (*) 3.87 - 5.11 MIL/uL   Hemoglobin 12.2  12.0 - 15.0 g/dL   HCT 16.1  09.6 - 04.5 %   MCV 95.1  78.0 - 100.0 fL   MCH 31.8  26.0 - 34.0 pg   MCHC 33.4  30.0 - 36.0 g/dL   RDW 40.9  81.1 - 91.4 %   Platelets 234  150 - 400 K/uL  BASIC METABOLIC PANEL      Result Value Range   Sodium 141  135 - 145 mEq/L   Potassium 4.1  3.5 - 5.1 mEq/L   Chloride 101  96 - 112 mEq/L   CO2 29  19 - 32 mEq/L   Glucose, Bld 156 (*) 70 - 99 mg/dL   BUN 32 (*) 6 - 23 mg/dL   Creatinine, Ser 7.82 (*) 0.50 - 1.10 mg/dL   Calcium 9.5  8.4 - 95.6 mg/dL   GFR calc non Af Amer 21 (*) >90 mL/min   GFR calc Af Amer 24 (*) >90 mL/min  PRO B NATRIURETIC PEPTIDE      Result Value Range   Pro B Natriuretic peptide (BNP) 945.9 (*) 0 - 125 pg/mL  TROPONIN I      Result Value Range   Troponin I <0.30  <0.30 ng/mL  PROTIME-INR      Result Value Range   Prothrombin Time 12.4  11.6 - 15.2 seconds   INR 0.94  0.00 - 1.49  TROPONIN I      Result Value Range   Troponin I <0.30  <0.30 ng/mL    Imaging Review Dg Chest 2 View  12/14/2012   CLINICAL DATA:  Chest pain  EXAM: CHEST  2 VIEW  COMPARISON:  Prior chest x-ray 06/10/2010  FINDINGS: Low inspiratory volumes with mild bibasilar atelectasis. Stable borderline cardiomegaly. The lungs are otherwise clear without evidence of pulmonary edema. No  pneumothorax or pleural effusion. A metallic stent projects over the heart, likely within the LAD. No acute osseous abnormality.  IMPRESSION: 1. Streaky opacities in the inferior lingula are favored to reflect atelectasis or scarring. 2. Stable borderline cardiomegaly. 3. No acute cardiopulmonary process by conventional radiography.   Electronically Signed   By: Malachy Moan M.D.   On: 12/14/2012 12:43    EKG Interpretation    Date/Time:  Monday December 14 2012 10:46:08 EST Ventricular Rate:  59 PR Interval:  232 QRS Duration: 78 QT Interval:  442 QTC Calculation: 437 R Axis:   33 Text Interpretation:  Sinus bradycardia with 1st degree A-V block with Premature atrial complexes in a pattern of bigeminy Low voltage QRS Nonspecific T wave abnormality Abnormal ECG When compared with ECG of 11-Jun-2010 05:33, Premature atrial complexes are now Present Nonspecific T wave abnormality, worse in Lateral leads Confirmed by Sriansh Farra  MD, Matis Monnier ([id number]) on 12/14/2012 11:10:54 AM            MDM   1. Chest pain   2. Renal insufficiency    Chest pain workup without any significant findings EKG without any acute changes troponin x2 negative. Chest x-ray without pneumonia pulmonary edema pneumothorax. Renal insufficiency definitely present sounds as if nephrology is been following you every 4 months he relates that they're aware that has been increasing. I will call and make an appointment to have them doublecheck you sent. Also followup with your regular Dr. in cardiology. Important to return for any new or worse chest pain or chest pain lasting more than  5 minutes. Your symptoms that she had regarding the chest pain but less then now 10 minutes more like 5 minutes troponins x2 being negative needs is unlikely to be significant unstable angina and certain of the duration of the pain has not been long enough to be consistent with an acute cardiac event.    I personally performed the services  described in this documentation, which was scribed in my presence. The recorded information has been reviewed and is accurate.     [name] Jakes, MD 12/14/12 [phone]

## 2012-12-14 NOTE — ED Notes (Signed)
Patient c/o mid sternal chest pain that radiates into neck and left arm since Friday. Per patient has extensive cardiac hx with MI and 13 stents. Patient reports this pain feels like the prior times she had MI.

## 2012-12-22 ENCOUNTER — Ambulatory Visit (INDEPENDENT_AMBULATORY_CARE_PROVIDER_SITE_OTHER): Payer: Medicare Other | Admitting: Cardiology

## 2012-12-22 ENCOUNTER — Encounter: Payer: Self-pay | Admitting: Cardiology

## 2012-12-22 VITALS — BP 148/68 | HR 62 | Ht 62.0 in | Wt 183.0 lb

## 2012-12-22 DIAGNOSIS — N184 Chronic kidney disease, stage 4 (severe): Secondary | ICD-10-CM | POA: Insufficient documentation

## 2012-12-22 DIAGNOSIS — E785 Hyperlipidemia, unspecified: Secondary | ICD-10-CM

## 2012-12-22 DIAGNOSIS — I2510 Atherosclerotic heart disease of native coronary artery without angina pectoris: Secondary | ICD-10-CM

## 2012-12-22 MED ORDER — ISOSORBIDE MONONITRATE ER 60 MG PO TB24: ORAL_TABLET | ORAL | Status: AC

## 2012-12-22 NOTE — Assessment & Plan Note (Signed)
She is reporting symptoms consistent with angina, although recent ER visit did not show acute ST segment changes and normal cardiac markers were found. Could be some component of volume overload contributing, however still concerned about progressive coronary atherosclerosis since her last catheterization. Particularly in light of renal insufficiency, we will plan to optimize medical therapy first. Imdur will be increased to 60 mg morning and 30 mg in the evening. She is back to prior doses of Lasix and Diovan. If her symptoms accelerate, she will likely require hospital admission at [facility] for stabilization of renal function and repeat coronary angiography with limited contrast.

## 2012-12-22 NOTE — Progress Notes (Signed)
Clinical Summary [name] is a 67 y.o.female last seen in June. She was recently seen in the ER with chest pain symptoms. Troponin I level was negative. ECG showed sinus rhythm with prolonged PR interval and PACs, nonspecific ST changes. Chest x-ray suggested a component of atelectasis or scarring in the inferior lingula, no other acute findings. She was discharged home without further cardiac workup.   Additional lab work showed potassium 4.1, BUN 32, creatinine 2.3.  Last cardiac catheterization was in June 2012 demonstrating patent stent site within the LAD, circumflex, and RCA distribution. There was moderate stenosis within the proximal LAD that was managed medically.  She provides additional history that prior to her onset in symptoms, she did have doses of both Lasix and Diovan decreased by her nephrologist with concerns about progressive renal insufficiency. She did note worsening edema after these changes, eventually moved her medicines back to her prior doses. She continues to have intermittent chest and left arm discomfort that sounds like angina, although less intense.   Allergies  Allergen Reactions  . Sulfonamide Derivatives     REACTION: throat swelling    Current Outpatient Prescriptions  Medication Sig Dispense Refill  . amitriptyline (ELAVIL) 100 MG tablet Take 100 mg by mouth at bedtime.        Marland Kitchen amLODipine (NORVASC) 10 MG tablet Take 10 mg by mouth daily.        Marland Kitchen aspirin 81 MG tablet Take 81 mg by mouth daily.        . carvedilol (COREG) 25 MG tablet Take 25 mg by mouth 2 (two) times daily.        . cholecalciferol (VITAMIN D) 1000 UNITS tablet Take 2,000 Units by mouth daily.      . cloNIDine (CATAPRES) 0.3 MG tablet Take 0.3 mg by mouth 2 (two) times daily.        . clopidogrel (PLAVIX) 75 MG tablet Take 75 mg by mouth daily.        . furosemide (LASIX) 80 MG tablet Take 80 mg by mouth 2 (two) times daily.       Marland Kitchen glyBURIDE micronized (GLYNASE) 6 MG tablet  Take 6 mg by mouth daily.       . hydrALAZINE (APRESOLINE) 25 MG tablet Take 25 mg by mouth 3 (three) times daily.      . insulin glargine (LANTUS) 100 UNIT/ML injection Inject 50 Units into the skin 2 (two) times daily.       . insulin glulisine (APIDRA) 100 UNIT/ML injection Inject 15 Units into the skin 3 (three) times daily with meals.       . isosorbide mononitrate (IMDUR) 60 MG 24 hr tablet Take 60 mg by mouth daily.        Marland Kitchen levothyroxine (SYNTHROID, LEVOTHROID) 150 MCG tablet Take 150 mcg by mouth daily before breakfast.      . nabumetone (RELAFEN) 750 MG tablet Take 750 mg by mouth 2 (two) times daily.        . nitroGLYCERIN (NITROSTAT) 0.4 MG SL tablet Place 0.4 mg under the tongue every 5 (five) minutes as needed.        . Omega-3 Fatty Acids (FISH OIL) 1000 MG CAPS Take by mouth. Take 3 in am and 2 at bedtime       . polyethylene glycol powder (MIRALAX) powder Take 17 g by mouth daily as needed for mild constipation. In 8 oz of water or juice      . potassium chloride  SA (K-DUR,KLOR-CON) 20 MEQ tablet Take 20 mEq by mouth daily.        . simvastatin (ZOCOR) 40 MG tablet Take 40 mg by mouth daily.      . traMADol (ULTRAM) 50 MG tablet Take 1 tablet (50 mg total) by mouth every 6 (six) hours as needed.  20 tablet  0  . valsartan (DIOVAN) 320 MG tablet Take 320 mg by mouth daily.        . vitamin C (ASCORBIC ACID) 500 MG tablet Take 500 mg by mouth daily.        . vitamin E 400 UNIT capsule Take 400 Units by mouth daily.         No current facility-administered medications for this visit.    Past Medical History  Diagnosis Date  . Fibromyalgia   . Hypothyroidism   . Diabetes mellitus, type 2   . Coronary atherosclerosis of native coronary artery     BMS circ/RCA 2001, BMS RCA 2003, PTCA RCA/ramus 2004, DES RCA 2005, DES RCA 2007, DES OM/LAD 2009, DES RCA 7/11, LVEF 65%  . Hyperlipidemia   . Essential hypertension, benign     Social History Ms. Arave reports that she has  never smoked. She has never used smokeless tobacco. Ms. Zellman reports that she does not drink alcohol.  Review of Systems Negative except as outlined.  Physical Examination Filed Vitals:   12/22/12 1313  BP: 148/68  Pulse: 62   Filed Weights   12/22/12 1313  Weight: 183 lb (83.008 kg)    Obese woman, appears comfortable at rest.  HEENT: Conjunctiva and lids normal, oropharynx clear with moist mucosa.  Neck: Supple, no elevated JVP or carotid bruits, no thyromegaly.  Lungs: Clear to auscultation, nonlabored breathing at rest.  Cardiac: Regular rate and rhythm, no S3, soft systolic murmur, no pericardial rub.  Abdomen: Soft, protuberant, nontender, bowel sounds present, no guarding or rebound.  Extremities: Trace edema, distal pulses 1+. Skin: Warm and dry. Musculoskeletal: No kyphosis. Neuropsychiatric: Alert and oriented x3, affect appropriate.   Problem List and Plan   CAD, NATIVE VESSEL She is reporting symptoms consistent with angina, although recent ER visit did not show acute ST segment changes and normal cardiac markers were found. Could be some component of volume overload contributing, however still concerned about progressive coronary atherosclerosis since her last catheterization. Particularly in light of renal insufficiency, we will plan to optimize medical therapy first. Imdur will be increased to 60 mg morning and 30 mg in the evening. She is back to prior doses of Lasix and Diovan. If her symptoms accelerate, she will likely require hospital admission at [facility] for stabilization of renal function and repeat coronary angiography with limited contrast.  HYPERLIPIDEMIA-MIXED She continues on Zocor.  CKD (chronic kidney disease) stage 3, GFR 30-59 ml/min Most recent creatinine 2.3. She is followed by Dr. Kristian Covey.    [name], M.D., F.A.C.C.

## 2012-12-22 NOTE — Assessment & Plan Note (Signed)
She continues on Zocor. 

## 2012-12-22 NOTE — Patient Instructions (Addendum)
Your physician recommends that you schedule a follow-up appointment in: 6 WEEKS IN [facility] OFF[facility]  Your physician has recommended you make the following change in your medication:   1) INCREASE IMDUR TO 60MG  IN THE AM AND 30MG  IN THE PM

## 2012-12-22 NOTE — Addendum Note (Signed)
Addended by: Derry Lory A on: 12/22/2012 01:38 PM   Modules accepted: Orders

## 2012-12-22 NOTE — Assessment & Plan Note (Signed)
Most recent creatinine 2.3. She is followed by Dr. Kristian Covey.

## 2013-01-01 ENCOUNTER — Encounter: Payer: Self-pay | Admitting: Cardiovascular Disease

## 2013-01-11 ENCOUNTER — Encounter: Payer: Self-pay | Admitting: Cardiovascular Disease

## 2013-02-04 ENCOUNTER — Ambulatory Visit (INDEPENDENT_AMBULATORY_CARE_PROVIDER_SITE_OTHER): Payer: Medicare Other | Admitting: Cardiology

## 2013-02-04 ENCOUNTER — Encounter: Payer: Self-pay | Admitting: Cardiology

## 2013-02-04 VITALS — BP 170/77 | HR 58 | Ht 62.0 in | Wt 179.0 lb

## 2013-02-04 DIAGNOSIS — N183 Chronic kidney disease, stage 3 unspecified: Secondary | ICD-10-CM

## 2013-02-04 DIAGNOSIS — I2510 Atherosclerotic heart disease of native coronary artery without angina pectoris: Secondary | ICD-10-CM

## 2013-02-04 NOTE — Assessment & Plan Note (Signed)
Symptomatically stable on the present regimen. No changes made today. Followup arranged.

## 2013-02-04 NOTE — Progress Notes (Signed)
Clinical Summary [name] is a 68 y.o.female seen in December 2014. At that time we modified medical therapy to better manage angina symptoms. We have been trying to hold off on coronary angiography with her chronic kidney disease, creatinine recently 2.5 per visit with Dr. Lowanda Foster.  Last cardiac catheterization was in June 2012 demonstrating patent stent site within the LAD, circumflex, and RCA distribution. There was moderate stenosis within the proximal LAD that was managed medically.  Fortunately, she has done very well , no recurrent angina on current regimen.  She did have a bout of the flu in the meanwhile, has been feeling better, was treated with Tamiflu.   Allergies  Allergen Reactions  . Sulfonamide Derivatives     REACTION: throat swelling    Current Outpatient Prescriptions  Medication Sig Dispense Refill  . amitriptyline (ELAVIL) 100 MG tablet Take 100 mg by mouth at bedtime.        Marland Kitchen amLODipine (NORVASC) 10 MG tablet Take 10 mg by mouth daily.        Marland Kitchen aspirin 81 MG tablet Take 81 mg by mouth daily.        . carvedilol (COREG) 25 MG tablet Take 25 mg by mouth 2 (two) times daily.        . cholecalciferol (VITAMIN D) 1000 UNITS tablet Take 1,000 Units by mouth daily.       . cloNIDine (CATAPRES) 0.3 MG tablet Take 0.3 mg by mouth 2 (two) times daily.        . clopidogrel (PLAVIX) 75 MG tablet Take 75 mg by mouth daily.        . furosemide (LASIX) 80 MG tablet Take 80 mg by mouth 2 (two) times daily.       Marland Kitchen glyBURIDE micronized (GLYNASE) 6 MG tablet Take 6 mg by mouth daily.       . hydrALAZINE (APRESOLINE) 25 MG tablet Take 25 mg by mouth 3 (three) times daily.      . insulin glargine (LANTUS) 100 UNIT/ML injection Inject 50 Units into the skin 2 (two) times daily.       . insulin glulisine (APIDRA) 100 UNIT/ML injection Inject 15 Units into the skin 3 (three) times daily with meals.       . isosorbide mononitrate (IMDUR) 60 MG 24 hr tablet TAKE 60MG  IN THE AM  AND 30MG  IN THE PM (TAKE HALF TABLET IF SCORED)  135 tablet  6  . levothyroxine (SYNTHROID, LEVOTHROID) 150 MCG tablet Take 150 mcg by mouth daily before breakfast.      . nabumetone (RELAFEN) 750 MG tablet Take 750 mg by mouth 2 (two) times daily.        . nitroGLYCERIN (NITROSTAT) 0.4 MG SL tablet Place 0.4 mg under the tongue every 5 (five) minutes as needed.        . Omega-3 Fatty Acids (FISH OIL) 1000 MG CAPS Take by mouth. Take 3 in am and 2 at bedtime       . polyethylene glycol powder (MIRALAX) powder Take 17 g by mouth daily as needed for mild constipation. In 8 oz of water or juice      . potassium chloride SA (K-DUR,KLOR-CON) 20 MEQ tablet Take 20 mEq by mouth daily.        . simvastatin (ZOCOR) 40 MG tablet Take 40 mg by mouth daily.      . valsartan (DIOVAN) 320 MG tablet Take 320 mg by mouth daily.        Marland Kitchen  vitamin C (ASCORBIC ACID) 500 MG tablet Take 500 mg by mouth daily.        . vitamin E 400 UNIT capsule Take 400 Units by mouth daily.         No current facility-administered medications for this visit.    Past Medical History  Diagnosis Date  . Fibromyalgia   . Hypothyroidism   . Diabetes mellitus, type 2   . Coronary atherosclerosis of native coronary artery     BMS circ/RCA 2001, BMS RCA 2003, PTCA RCA/ramus 2004, DES RCA 2005, DES RCA 2007, DES OM/LAD 2009, DES RCA 7/11, LVEF 65%  . Hyperlipidemia   . Essential hypertension, benign     Social History Ms. Glock reports that she has never smoked. She has never used smokeless tobacco. Ms. Shrieves reports that she does not drink alcohol.  Review of Systems Negative except as outlined.  Physical Examination Filed Vitals:   02/04/13 1522  BP: 170/77  Pulse: 58   Filed Weights   02/04/13 1522  Weight: 179 lb (81.194 kg)    Obese woman, appears comfortable at rest.  HEENT: Conjunctiva and lids normal, oropharynx clear with moist mucosa.  Neck: Supple, no elevated JVP or carotid bruits, no thyromegaly.    Lungs: Clear to auscultation, nonlabored breathing at rest.  Cardiac: Regular rate and rhythm, no S3, soft systolic murmur, no pericardial rub.  Abdomen: Soft, protuberant, nontender, bowel sounds present, no guarding or rebound.  Extremities: Trace edema, distal pulses 1+.    Problem List and Plan   CAD, NATIVE VESSEL Symptomatically stable on the present regimen. No changes made today. Followup arranged.  CKD (chronic kidney disease) stage 3, GFR 30-59 ml/min Recent creatinine 2.5, followed by Dr. Lowanda Foster.    Satira Sark, M.D., F.A.C.C.

## 2013-02-04 NOTE — Patient Instructions (Signed)
Continue all current medications. Your physician wants you to follow up in: 6 months.  You will receive a reminder letter in the mail one-two months in advance.  If you don't receive a letter, please call our office to schedule the follow up appointment   

## 2013-02-04 NOTE — Assessment & Plan Note (Signed)
Recent creatinine 2.5, followed by Dr. Lowanda Foster.

## 2013-03-19 ENCOUNTER — Telehealth: Payer: Self-pay | Admitting: *Deleted

## 2013-03-19 NOTE — Telephone Encounter (Signed)
They would like to take her off plavix for five days for melanoma and lymphnode incision.

## 2013-03-22 NOTE — Telephone Encounter (Signed)
Attempted to return call to Merlyn Lot, Wanda (Anesthesia at [facility]).  [phone]  She had already left for the day, left message to return call tomorrow morning.

## 2013-03-24 NOTE — Telephone Encounter (Signed)
Discussed message with Merlyn Lot, PA yesterday.  Notes faxed to [facility] office as requested.  Discussed with Dr. Domenic Polite & per his recommendations - send note from January 2015 & she can hold Plavix temporarily as requested.    Notes faxed this afternoon.  Info will be scanned into EPIC.

## 2013-05-18 ENCOUNTER — Encounter: Payer: Self-pay | Admitting: Cardiology

## 2013-07-22 ENCOUNTER — Ambulatory Visit (INDEPENDENT_AMBULATORY_CARE_PROVIDER_SITE_OTHER): Payer: Medicare Other | Admitting: Cardiology

## 2013-07-22 ENCOUNTER — Encounter: Payer: Self-pay | Admitting: Cardiology

## 2013-07-22 VITALS — BP 134/73 | HR 60 | Ht 62.0 in | Wt 180.0 lb

## 2013-07-22 DIAGNOSIS — N184 Chronic kidney disease, stage 4 (severe): Secondary | ICD-10-CM

## 2013-07-22 DIAGNOSIS — R011 Cardiac murmur, unspecified: Secondary | ICD-10-CM

## 2013-07-22 DIAGNOSIS — I499 Cardiac arrhythmia, unspecified: Secondary | ICD-10-CM

## 2013-07-22 DIAGNOSIS — I10 Essential (primary) hypertension: Secondary | ICD-10-CM

## 2013-07-22 DIAGNOSIS — I2510 Atherosclerotic heart disease of native coronary artery without angina pectoris: Secondary | ICD-10-CM

## 2013-07-22 NOTE — Assessment & Plan Note (Signed)
Question of atrial fibrillation documented by ECG in March at [facility]. I am requesting the actual copies of the tracings to review. She has not had atrial fibrillation documented previously. Her current ECG and previous tracings are consistent with sinus rhythm with PACs, some of which are blocked.

## 2013-07-22 NOTE — Patient Instructions (Signed)
Your physician recommends that you schedule a follow-up appointment in: 6 months. You will receive a reminder letter in the mail in about 4 months reminding you to call and schedule your appointment. If you don't receive this letter, please contact our office. Your physician recommends that you continue on your current medications as directed. Please refer to the Current Medication list given to you today. Your physician has requested that you have an echocardiogram. Echocardiography is a painless test that uses sound waves to create images of your heart. It provides your doctor with information about the size and shape of your heart and how well your heart's chambers and valves are working. This procedure takes approximately one hour. There are no restrictions for this procedure.  

## 2013-07-22 NOTE — Assessment & Plan Note (Signed)
Possibly sclerotic aortic valve, will evaluate further by echocardiogram.

## 2013-07-22 NOTE — Assessment & Plan Note (Signed)
Blood pressure is fairly well controlled on current regimen.

## 2013-07-22 NOTE — Assessment & Plan Note (Signed)
Symptomatically stable with multivessel disease status post multiple percutaneous interventions over time as detailed above. She continues on DAPT long-term. Continue medical therapy and observation.

## 2013-07-22 NOTE — Assessment & Plan Note (Signed)
Most recent creatinine 2.8, patient followed by Dr. Lowanda Foster.

## 2013-07-22 NOTE — Progress Notes (Signed)
Clinical Summary [name] is a 68 y.o.female last seen in January. She reports no accelerating angina symptoms. Interval history includes removal of some melanoma lesions at [facility] back in March. She reportedly was documented to have atrial fibrillation by ECG at that time. I can see the report of these tracings in Care Everywhere, but not the actual tracings themselves. She has no history of atrial fibrillation. ECG today shows sinus rhythm with PACs, prolonged PR interval and blocked PACs.  Lab work from May showed LDL 97, HDL 43, triglycerides 97, cholesterol 159, BUN 50, creatinine 2.8, potassium 4.3, hemoglobin A1c 8.3. She continues to follow with Dr. Lowanda Foster for chronic kidney disease.  Last cardiac catheterization was in June 2012 demonstrating patent stent site within the LAD, circumflex, and RCA distribution. There was moderate stenosis within the proximal LAD that was managed medically.   Allergies  Allergen Reactions  . Sulfonamide Derivatives     REACTION: throat swelling    Current Outpatient Prescriptions  Medication Sig Dispense Refill  . amitriptyline (ELAVIL) 10 MG tablet Take 30 mg by mouth at bedtime. Takes in addition to the 100mg  at bedtime.      Marland Kitchen amitriptyline (ELAVIL) 100 MG tablet Take 100 mg by mouth at bedtime.        Marland Kitchen amLODipine (NORVASC) 10 MG tablet Take 10 mg by mouth daily.        Marland Kitchen aspirin 81 MG tablet Take 81 mg by mouth daily.        . carvedilol (COREG) 25 MG tablet Take 25 mg by mouth 2 (two) times daily.        . cholecalciferol (VITAMIN D) 1000 UNITS tablet Take 1,000 Units by mouth 2 (two) times daily.       . cloNIDine (CATAPRES) 0.3 MG tablet Take 0.3 mg by mouth 2 (two) times daily.        . clopidogrel (PLAVIX) 75 MG tablet Take 75 mg by mouth daily.        . furosemide (LASIX) 80 MG tablet Take 80 mg by mouth 2 (two) times daily.       Marland Kitchen glyBURIDE micronized (GLYNASE) 6 MG tablet Take 6 mg by mouth daily.       . hydrALAZINE (APRESOLINE)  25 MG tablet Take 25 mg by mouth 3 (three) times daily.      . insulin glargine (LANTUS) 100 UNIT/ML injection Inject 20-30 Units into the skin at bedtime.       . insulin glulisine (APIDRA) 100 UNIT/ML injection Inject 5 Units into the skin. After breakfast & lunch.      . isosorbide mononitrate (IMDUR) 60 MG 24 hr tablet TAKE 60MG  IN THE AM AND 30MG  IN THE PM (TAKE HALF TABLET IF SCORED)  135 tablet  6  . levothyroxine (SYNTHROID, LEVOTHROID) 150 MCG tablet Take 150 mcg by mouth daily before breakfast.      . nabumetone (RELAFEN) 750 MG tablet Take 750 mg by mouth 2 (two) times daily.        . nitroGLYCERIN (NITROSTAT) 0.4 MG SL tablet Place 0.4 mg under the tongue every 5 (five) minutes as needed.        . Omega-3 Fatty Acids (FISH OIL) 1000 MG CAPS Take by mouth. Take 3 in am and 2 at bedtime       . polyethylene glycol powder (MIRALAX) powder Take 17 g by mouth daily as needed for mild constipation. In 8 oz of water or juice      .  potassium chloride SA (K-DUR,KLOR-CON) 20 MEQ tablet Take 20 mEq by mouth daily.        . simvastatin (ZOCOR) 40 MG tablet Take 40 mg by mouth daily.      . valsartan (DIOVAN) 320 MG tablet Take 320 mg by mouth daily.        . vitamin C (ASCORBIC ACID) 500 MG tablet Take 500 mg by mouth daily.        . vitamin E 400 UNIT capsule Take 400 Units by mouth daily.         No current facility-administered medications for this visit.    Past Medical History  Diagnosis Date  . Fibromyalgia   . Hypothyroidism   . Diabetes mellitus, type 2   . Coronary atherosclerosis of native coronary artery     BMS circ/RCA 2001, BMS RCA 2003, PTCA RCA/ramus 2004, DES RCA 2005, DES RCA 2007, DES OM/LAD 2009, DES RCA 7/11, LVEF 65%  . Hyperlipidemia   . Essential hypertension, benign     Social History Ms. Pottenger reports that she has never smoked. She has never used smokeless tobacco. Ms. Pettijohn reports that she does not drink alcohol.  Review of Systems No palpitations,  syncope. No bleeding episodes on DAPT. Reports chronic leg weakness. Other systems reviewed and negative.  Physical Examination Filed Vitals:   07/22/13 1441  BP: 134/73  Pulse: 60   Filed Weights   07/22/13 1441  Weight: 180 lb (81.647 kg)    Obese woman, appears comfortable at rest.  HEENT: Conjunctiva and lids normal, oropharynx clear with moist mucosa.  Neck: Supple, no elevated JVP or carotid bruits, no thyromegaly.  Lungs: Clear to auscultation, nonlabored breathing at rest.  Cardiac: Regular rate and rhythm, no S3, 2/6 basal systolic murmur, no pericardial rub.  Abdomen: Soft, protuberant, nontender, bowel sounds present, no guarding or rebound.  Extremities: Trace edema, distal pulses 1+.  Skin: Warm and dry. Mus[place] skeletal: No kyphosis. Neuropsychiatric: Alert and oriented x3, affect appropriate.   Problem List and Plan   CAD, NATIVE VESSEL Symptomatically stable with multivessel disease status post multiple percutaneous interventions over time as detailed above. She continues on DAPT long-term. Continue medical therapy and observation.  Arrhythmia Question of atrial fibrillation documented by ECG in March at [facility]. I am requesting the actual copies of the tracings to review. She has not had atrial fibrillation documented previously. Her current ECG and previous tracings are consistent with sinus rhythm with PACs, some of which are blocked.  CKD (chronic kidney disease) stage 4, GFR 15-29 ml/min Most recent creatinine 2.8, patient followed by Dr. Lowanda Foster.  Essential hypertension, benign Blood pressure is fairly well controlled on current regimen.  Cardiac murmur Possibly sclerotic aortic valve, will evaluate further by echocardiogram.    Satira Sark, M.D., F.A.C.C.

## 2013-07-23 ENCOUNTER — Encounter: Payer: Self-pay | Admitting: Cardiology

## 2013-07-23 NOTE — Progress Notes (Signed)
Received faxed copy of ECG done in March at [facility] that was interpreted as atrial fibrillation. I reviewed the tracing, and the patient actually had atrial bigeminy (frequent PACs), underlying rhythm being sinus rhythm. Tracing to be copied into EPIC.

## 2013-07-23 NOTE — Progress Notes (Signed)
Patient informed. 

## 2013-08-18 ENCOUNTER — Other Ambulatory Visit: Payer: Medicare Other

## 2013-08-26 ENCOUNTER — Other Ambulatory Visit (INDEPENDENT_AMBULATORY_CARE_PROVIDER_SITE_OTHER): Payer: Medicare Other

## 2013-08-26 ENCOUNTER — Other Ambulatory Visit: Payer: Self-pay

## 2013-08-26 DIAGNOSIS — R011 Cardiac murmur, unspecified: Secondary | ICD-10-CM

## 2013-08-26 DIAGNOSIS — I2510 Atherosclerotic heart disease of native coronary artery without angina pectoris: Secondary | ICD-10-CM

## 2013-08-26 DIAGNOSIS — I059 Rheumatic mitral valve disease, unspecified: Secondary | ICD-10-CM

## 2013-08-30 ENCOUNTER — Telehealth: Payer: Self-pay | Admitting: *Deleted

## 2013-08-30 NOTE — Telephone Encounter (Signed)
Message copied by Laurine Blazer on Mon Aug 30, 2013  4:15 PM ------      Message from: Satira Sark      Created: Fri Aug 27, 2013 10:47 AM       Reviewed. LVEF remains normal. Aortic valve is mildly sclerotic but not stenotic. Overall reassuring results. Continue observation. ------

## 2013-08-30 NOTE — Telephone Encounter (Signed)
Notes Recorded by Laurine Blazer, LPN on 0/93/1121 at 6:24 PM Patient notified.

## 2014-01-11 ENCOUNTER — Encounter: Payer: Self-pay | Admitting: Cardiology

## 2014-01-31 ENCOUNTER — Encounter: Payer: Self-pay | Admitting: Cardiology

## 2014-01-31 ENCOUNTER — Ambulatory Visit (INDEPENDENT_AMBULATORY_CARE_PROVIDER_SITE_OTHER): Payer: Medicare Other | Admitting: Cardiology

## 2014-01-31 VITALS — BP 175/84 | HR 53 | Ht 63.0 in | Wt 167.1 lb

## 2014-01-31 DIAGNOSIS — I2510 Atherosclerotic heart disease of native coronary artery without angina pectoris: Secondary | ICD-10-CM

## 2014-01-31 DIAGNOSIS — E782 Mixed hyperlipidemia: Secondary | ICD-10-CM

## 2014-01-31 DIAGNOSIS — N184 Chronic kidney disease, stage 4 (severe): Secondary | ICD-10-CM

## 2014-01-31 DIAGNOSIS — R011 Cardiac murmur, unspecified: Secondary | ICD-10-CM

## 2014-01-31 NOTE — Assessment & Plan Note (Signed)
She continues on Zocor and omega-3 supplements. Recent lipid profile showed cholesterol 153 and LDL 84, normal LFTs.

## 2014-01-31 NOTE — Patient Instructions (Signed)

## 2014-01-31 NOTE — Assessment & Plan Note (Signed)
Stable on examination. Echocardiogram from last year showed sclerotic aortic valve with preserved LVEF and moderate to severe LVH.

## 2014-01-31 NOTE — Assessment & Plan Note (Signed)
Symptomatically stable medical therapy as outlined above. Continue medical therapy and observation.

## 2014-01-31 NOTE — Assessment & Plan Note (Signed)
Recent lab work per Dr. Melina Copa reviewed, potassium 5.1 and creatinine 3.3.

## 2014-01-31 NOTE — Progress Notes (Signed)
.    Reason for visit: CAD  Clinical Summary Ms. Godbee is a 69 y.o.female last seen in July 2015. She comes in for a routine visit today stating that she has been doing well overall. She did have a fall since I last saw her, slipped on the ground without syncope. It sounds like she had a hematoma of her right buttock, fortunately did not have a hip fracture. She is using a cane. She does not report any palpitations or dizziness.  From a cardiac perspective, she reports no angina symptoms. She continues on aspirin, Norvasc, Coreg, hydralazine, Imdur, and Plavix. She has not required any nitroglycerin.  Echocardiogram from August 2015 reported moderate to severe LVH with LVEF 60-65%, increased left atrial pressure, sclerotic aortic valve without stenosis, mild mitral regurgitation.  Last cardiac catheterization was in June 2012 demonstrating patent stent site within the LAD, circumflex, and RCA distribution. There was moderate stenosis within the proximal LAD that was managed medically.  Allergies  Allergen Reactions  . Sulfonamide Derivatives     REACTION: throat swelling    Current Outpatient Prescriptions  Medication Sig Dispense Refill  . amitriptyline (ELAVIL) 10 MG tablet Take 30 mg by mouth at bedtime. Takes in addition to the 100mg  at bedtime.    Marland Kitchen amitriptyline (ELAVIL) 100 MG tablet Take 100 mg by mouth at bedtime.      Marland Kitchen amLODipine (NORVASC) 10 MG tablet Take 10 mg by mouth daily.      Marland Kitchen aspirin 81 MG tablet Take 81 mg by mouth daily.      . carvedilol (COREG) 25 MG tablet Take 25 mg by mouth 2 (two) times daily.      . Cholecalciferol (VITAMIN D) 2000 UNITS CAPS Take 1 capsule by mouth daily.    . cloNIDine (CATAPRES) 0.3 MG tablet Take 0.3 mg by mouth 2 (two) times daily.      . clopidogrel (PLAVIX) 75 MG tablet Take 75 mg by mouth daily.      . furosemide (LASIX) 80 MG tablet Take 80 mg by mouth 2 (two) times daily.     Marland Kitchen glyBURIDE micronized (GLYNASE) 6 MG tablet Take 6  mg by mouth daily.     . hydrALAZINE (APRESOLINE) 25 MG tablet Take 25 mg by mouth 3 (three) times daily.    . insulin glargine (LANTUS) 100 UNIT/ML injection Inject 20-30 Units into the skin at bedtime.     . insulin glulisine (APIDRA) 100 UNIT/ML injection Inject 5 Units into the skin. After breakfast & lunch.    . isosorbide mononitrate (IMDUR) 60 MG 24 hr tablet TAKE 60MG  IN THE AM AND 30MG  IN THE PM (TAKE HALF TABLET IF SCORED) 135 tablet 6  . levothyroxine (SYNTHROID, LEVOTHROID) 150 MCG tablet Take 150 mcg by mouth daily before breakfast.    . nabumetone (RELAFEN) 750 MG tablet Take 750 mg by mouth 2 (two) times daily.      . nitroGLYCERIN (NITROSTAT) 0.4 MG SL tablet Place 0.4 mg under the tongue every 5 (five) minutes as needed.      . Omega-3 Fatty Acids (FISH OIL) 1000 MG CAPS Take by mouth. Take 3 in am and 2 at bedtime     . polyethylene glycol powder (MIRALAX) powder Take 17 g by mouth daily as needed for mild constipation. In 8 oz of water or juice    . potassium chloride SA (K-DUR,KLOR-CON) 20 MEQ tablet Take 20 mEq by mouth daily.      . simvastatin (ZOCOR)  40 MG tablet Take 40 mg by mouth daily.    . valsartan (DIOVAN) 320 MG tablet Take 320 mg by mouth daily.      . vitamin C (ASCORBIC ACID) 500 MG tablet Take 500 mg by mouth daily.      . vitamin E 400 UNIT capsule Take 400 Units by mouth daily.       No current facility-administered medications for this visit.    Past Medical History  Diagnosis Date  . Fibromyalgia   . Hypothyroidism   . Diabetes mellitus, type 2   . Coronary atherosclerosis of native coronary artery     BMS circ/RCA 2001, BMS RCA 2003, PTCA RCA/ramus 2004, DES RCA 2005, DES RCA 2007, DES OM/LAD 2009, DES RCA 7/11, LVEF 65%  . Hyperlipidemia   . Essential hypertension, benign     Social History Ms. Hutmacher reports that she has never smoked. She has never used smokeless tobacco. Ms. Olesky reports that she does not drink alcohol.  Review of  Systems Complete review of systems negative except as otherwise outlined in the clinical summary and also the following. Stable appetite. No spontaneous bleeding problems.  Physical Examination Filed Vitals:   01/31/14 1423  BP: 175/84  Pulse: 53    Wt Readings from Last 3 Encounters:  01/31/14 167 lb 1.9 oz (75.805 kg)  07/22/13 180 lb (81.647 kg)  02/04/13 179 lb (81.194 kg)   Obese woman, appears comfortable at rest.  HEENT: Conjunctiva and lids normal, oropharynx clear with moist mucosa.  Neck: Supple, no elevated JVP or carotid bruits, no thyromegaly.  Lungs: Clear to auscultation, nonlabored breathing at rest.  Cardiac: Regular rate and rhythm, no S3, 2/6 basal systolic murmur, no pericardial rub.  Abdomen: Soft, protuberant, nontender, bowel sounds present, no guarding or rebound.  Extremities: Trace edema, distal pulses 1+.    Problem List and Plan   CAD, NATIVE VESSEL Symptomatically stable medical therapy as outlined above. Continue medical therapy and observation.   Mixed hyperlipidemia She continues on Zocor and omega-3 supplements. Recent lipid profile showed cholesterol 153 and LDL 84, normal LFTs.   CKD (chronic kidney disease) stage 4, GFR 15-29 ml/min Recent lab work per Dr. Melina Copa reviewed, potassium 5.1 and creatinine 3.3.   Cardiac murmur Stable on examination. Echocardiogram from last year showed sclerotic aortic valve with preserved LVEF and moderate to severe LVH.     Satira Sark, M.D., F.A.C.C.

## 2014-06-20 ENCOUNTER — Inpatient Hospital Stay (HOSPITAL_COMMUNITY): Payer: Medicare Other

## 2014-06-20 ENCOUNTER — Emergency Department (HOSPITAL_COMMUNITY): Payer: Medicare Other

## 2014-06-20 ENCOUNTER — Telehealth: Payer: Self-pay | Admitting: Cardiology

## 2014-06-20 ENCOUNTER — Inpatient Hospital Stay (HOSPITAL_COMMUNITY)
Admission: EM | Admit: 2014-06-20 | Discharge: 2014-06-23 | DRG: 194 | Disposition: A | Discharge status: Skilled Nursing Facility | Payer: Medicare Other | Attending: Internal Medicine | Admitting: Internal Medicine

## 2014-06-20 ENCOUNTER — Encounter (HOSPITAL_COMMUNITY): Payer: Self-pay | Admitting: Emergency Medicine

## 2014-06-20 DIAGNOSIS — E1122 Type 2 diabetes mellitus with diabetic chronic kidney disease: Secondary | ICD-10-CM | POA: Diagnosis present

## 2014-06-20 DIAGNOSIS — Z794 Long term (current) use of insulin: Secondary | ICD-10-CM

## 2014-06-20 DIAGNOSIS — N179 Acute kidney failure, unspecified: Secondary | ICD-10-CM | POA: Diagnosis present

## 2014-06-20 DIAGNOSIS — E039 Hypothyroidism, unspecified: Secondary | ICD-10-CM | POA: Diagnosis present

## 2014-06-20 DIAGNOSIS — R05 Cough: Secondary | ICD-10-CM

## 2014-06-20 DIAGNOSIS — I10 Essential (primary) hypertension: Secondary | ICD-10-CM | POA: Diagnosis present

## 2014-06-20 DIAGNOSIS — J189 Pneumonia, unspecified organism: Secondary | ICD-10-CM | POA: Diagnosis not present

## 2014-06-20 DIAGNOSIS — Z6831 Body mass index (BMI) 31.0-31.9, adult: Secondary | ICD-10-CM

## 2014-06-20 DIAGNOSIS — Z7982 Long term (current) use of aspirin: Secondary | ICD-10-CM

## 2014-06-20 DIAGNOSIS — R600 Localized edema: Secondary | ICD-10-CM | POA: Diagnosis not present

## 2014-06-20 DIAGNOSIS — I129 Hypertensive chronic kidney disease with stage 1 through stage 4 chronic kidney disease, or unspecified chronic kidney disease: Secondary | ICD-10-CM | POA: Diagnosis present

## 2014-06-20 DIAGNOSIS — I2510 Atherosclerotic heart disease of native coronary artery without angina pectoris: Secondary | ICD-10-CM | POA: Diagnosis present

## 2014-06-20 DIAGNOSIS — I509 Heart failure, unspecified: Secondary | ICD-10-CM

## 2014-06-20 DIAGNOSIS — R29810 Facial weakness: Secondary | ICD-10-CM | POA: Diagnosis present

## 2014-06-20 DIAGNOSIS — Z955 Presence of coronary angioplasty implant and graft: Secondary | ICD-10-CM | POA: Diagnosis not present

## 2014-06-20 DIAGNOSIS — Z85820 Personal history of malignant melanoma of skin: Secondary | ICD-10-CM | POA: Diagnosis not present

## 2014-06-20 DIAGNOSIS — E785 Hyperlipidemia, unspecified: Secondary | ICD-10-CM | POA: Diagnosis present

## 2014-06-20 DIAGNOSIS — D649 Anemia, unspecified: Secondary | ICD-10-CM | POA: Diagnosis present

## 2014-06-20 DIAGNOSIS — E119 Type 2 diabetes mellitus without complications: Secondary | ICD-10-CM

## 2014-06-20 DIAGNOSIS — Z79899 Other long term (current) drug therapy: Secondary | ICD-10-CM | POA: Diagnosis not present

## 2014-06-20 DIAGNOSIS — I5032 Chronic diastolic (congestive) heart failure: Secondary | ICD-10-CM | POA: Diagnosis present

## 2014-06-20 DIAGNOSIS — Z90710 Acquired absence of both cervix and uterus: Secondary | ICD-10-CM

## 2014-06-20 DIAGNOSIS — M1990 Unspecified osteoarthritis, unspecified site: Secondary | ICD-10-CM | POA: Diagnosis present

## 2014-06-20 DIAGNOSIS — Z993 Dependence on wheelchair: Secondary | ICD-10-CM

## 2014-06-20 DIAGNOSIS — M797 Fibromyalgia: Secondary | ICD-10-CM | POA: Diagnosis present

## 2014-06-20 DIAGNOSIS — E1142 Type 2 diabetes mellitus with diabetic polyneuropathy: Secondary | ICD-10-CM | POA: Diagnosis present

## 2014-06-20 DIAGNOSIS — E876 Hypokalemia: Secondary | ICD-10-CM | POA: Diagnosis present

## 2014-06-20 DIAGNOSIS — I252 Old myocardial infarction: Secondary | ICD-10-CM | POA: Diagnosis not present

## 2014-06-20 DIAGNOSIS — Z8249 Family history of ischemic heart disease and other diseases of the circulatory system: Secondary | ICD-10-CM

## 2014-06-20 DIAGNOSIS — Z7902 Long term (current) use of antithrombotics/antiplatelets: Secondary | ICD-10-CM | POA: Diagnosis not present

## 2014-06-20 DIAGNOSIS — E669 Obesity, unspecified: Secondary | ICD-10-CM | POA: Diagnosis present

## 2014-06-20 DIAGNOSIS — R29898 Other symptoms and signs involving the musculoskeletal system: Secondary | ICD-10-CM

## 2014-06-20 DIAGNOSIS — R531 Weakness: Secondary | ICD-10-CM

## 2014-06-20 DIAGNOSIS — N184 Chronic kidney disease, stage 4 (severe): Secondary | ICD-10-CM | POA: Diagnosis present

## 2014-06-20 DIAGNOSIS — R059 Cough, unspecified: Secondary | ICD-10-CM | POA: Diagnosis present

## 2014-06-20 HISTORY — DX: Acute myocardial infarction, unspecified: I21.9

## 2014-06-20 HISTORY — DX: Malignant melanoma of skin, unspecified: C43.9

## 2014-06-20 LAB — BASIC METABOLIC PANEL
Anion gap: 10 (ref 5–15)
BUN: 55 mg/dL — AB (ref 6–20)
CALCIUM: 9 mg/dL (ref 8.9–10.3)
CHLORIDE: 104 mmol/L (ref 101–111)
CO2: 24 mmol/L (ref 22–32)
Creatinine, Ser: 3.36 mg/dL — ABNORMAL HIGH (ref 0.44–1.00)
GFR calc Af Amer: 15 mL/min — ABNORMAL LOW (ref >60)
GFR, EST NON AFRICAN AMERICAN: 13 mL/min — AB (ref >60)
Glucose, Bld: 139 mg/dL — ABNORMAL HIGH (ref 65–99)
Potassium: 3.5 mmol/L (ref 3.5–5.1)
SODIUM: 138 mmol/L (ref 135–145)

## 2014-06-20 LAB — CBC WITH DIFFERENTIAL/PLATELET
BASOS ABS: 0 10*3/uL (ref 0.0–0.1)
Basophils Relative: 1 % (ref 0–1)
EOS ABS: 0.2 10*3/uL (ref 0.0–0.7)
Eosinophils Relative: 4 % (ref 0–5)
HCT: 32.9 % — ABNORMAL LOW (ref 36.0–46.0)
Hemoglobin: 10.7 g/dL — ABNORMAL LOW (ref 12.0–15.0)
Lymphocytes Relative: 21 % (ref 12–46)
Lymphs Abs: 1.3 10*3/uL (ref 0.7–4.0)
MCH: 31.6 pg (ref 26.0–34.0)
MCHC: 32.5 g/dL (ref 30.0–36.0)
MCV: 97.1 fL (ref 78.0–100.0)
Monocytes Absolute: 0.7 10*3/uL (ref 0.1–1.0)
Monocytes Relative: 12 % (ref 3–12)
NEUTROS ABS: 3.7 10*3/uL (ref 1.7–7.7)
NEUTROS PCT: 62 % (ref 43–77)
Platelets: 225 10*3/uL (ref 150–400)
RBC: 3.39 MIL/uL — ABNORMAL LOW (ref 3.87–5.11)
RDW: 13.1 % (ref 11.5–15.5)
WBC: 5.9 10*3/uL (ref 4.0–10.5)

## 2014-06-20 LAB — BRAIN NATRIURETIC PEPTIDE: B NATRIURETIC PEPTIDE 5: 267 pg/mL — AB (ref 0.0–100.0)

## 2014-06-20 LAB — TROPONIN I: Troponin I: <0.03 ng/mL (ref <0.031)

## 2014-06-20 LAB — TSH: TSH: 1.461 u[IU]/mL (ref 0.350–4.500)

## 2014-06-20 LAB — GLUCOSE, CAPILLARY: Glucose-Capillary: 101 mg/dL — ABNORMAL HIGH (ref 65–99)

## 2014-06-20 MED ORDER — ONDANSETRON HCL 4 MG PO TABS: 4.0000 mg | ORAL_TABLET | Freq: Four times a day (QID) | ORAL | Status: DC | PRN

## 2014-06-20 MED ORDER — AMLODIPINE BESYLATE 5 MG PO TABS
10.0000 mg | ORAL_TABLET | Freq: Every day | ORAL | Status: DC
  Administered 2014-06-21 – 2014-06-23 (×3): 10 mg via ORAL
  Filled 2014-06-20 (×3): qty 2

## 2014-06-20 MED ORDER — AZITHROMYCIN 500 MG IV SOLR
500.0000 mg | INTRAVENOUS | Status: DC
  Administered 2014-06-21 – 2014-06-22 (×2): 500 mg via INTRAVENOUS
  Filled 2014-06-20 (×3): qty 500

## 2014-06-20 MED ORDER — FUROSEMIDE 10 MG/ML IJ SOLN
80.0000 mg | Freq: Once | INTRAMUSCULAR | Status: AC
  Administered 2014-06-20: 80 mg via INTRAVENOUS
  Filled 2014-06-20: qty 8

## 2014-06-20 MED ORDER — IRBESARTAN 300 MG PO TABS
300.0000 mg | ORAL_TABLET | Freq: Every day | ORAL | Status: DC
  Administered 2014-06-21 – 2014-06-23 (×3): 300 mg via ORAL
  Filled 2014-06-20 (×3): qty 1

## 2014-06-20 MED ORDER — AZITHROMYCIN 500 MG IV SOLR: 500.0000 mg | INTRAVENOUS | Status: DC

## 2014-06-20 MED ORDER — AMITRIPTYLINE HCL 10 MG PO TABS
30.0000 mg | ORAL_TABLET | Freq: Every day | ORAL | Status: DC
  Administered 2014-06-20: 30 mg via ORAL
  Filled 2014-06-20: qty 3

## 2014-06-20 MED ORDER — VITAMIN E 180 MG (400 UNIT) PO CAPS
400.0000 [IU] | ORAL_CAPSULE | Freq: Every day | ORAL | Status: DC
  Administered 2014-06-21: 400 [IU] via ORAL
  Filled 2014-06-20 (×2): qty 1

## 2014-06-20 MED ORDER — INSULIN GLARGINE 100 UNIT/ML ~~LOC~~ SOLN: 20.0000 [IU] | Freq: Every day | SUBCUTANEOUS | Status: DC

## 2014-06-20 MED ORDER — HYDRALAZINE HCL 25 MG PO TABS
25.0000 mg | ORAL_TABLET | Freq: Three times a day (TID) | ORAL | Status: DC
  Administered 2014-06-20 – 2014-06-23 (×8): 25 mg via ORAL
  Filled 2014-06-20 (×8): qty 1

## 2014-06-20 MED ORDER — NITROGLYCERIN 0.4 MG SL SUBL: 0.4000 mg | SUBLINGUAL_TABLET | SUBLINGUAL | Status: DC | PRN

## 2014-06-20 MED ORDER — POLYETHYLENE GLYCOL 3350 17 GM/SCOOP PO POWD: 17.0000 g | Freq: Every day | ORAL | Status: DC | PRN

## 2014-06-20 MED ORDER — DEXTROSE 5 % IV SOLN
500.0000 mg | Freq: Once | INTRAVENOUS | Status: AC
  Administered 2014-06-20: 500 mg via INTRAVENOUS
  Filled 2014-06-20: qty 500

## 2014-06-20 MED ORDER — AMLODIPINE BESYLATE 5 MG PO TABS: 10.0000 mg | ORAL_TABLET | Freq: Every day | ORAL | Status: DC

## 2014-06-20 MED ORDER — SODIUM CHLORIDE 0.9 % IJ SOLN
3.0000 mL | Freq: Two times a day (BID) | INTRAMUSCULAR | Status: DC
  Administered 2014-06-20 – 2014-06-23 (×2): 3 mL via INTRAVENOUS

## 2014-06-20 MED ORDER — POLYETHYLENE GLYCOL 3350 17 G PO PACK: 17.0000 g | PACK | Freq: Every day | ORAL | Status: DC | PRN

## 2014-06-20 MED ORDER — HEPARIN SODIUM (PORCINE) 5000 UNIT/ML IJ SOLN
5000.0000 [IU] | Freq: Three times a day (TID) | INTRAMUSCULAR | Status: DC
  Administered 2014-06-20 – 2014-06-23 (×8): 5000 [IU] via SUBCUTANEOUS
  Filled 2014-06-20 (×8): qty 1

## 2014-06-20 MED ORDER — OMEGA-3-ACID ETHYL ESTERS 1 G PO CAPS
1.0000 g | ORAL_CAPSULE | Freq: Every day | ORAL | Status: DC
  Administered 2014-06-21 – 2014-06-23 (×3): 1 g via ORAL
  Filled 2014-06-20 (×3): qty 1

## 2014-06-20 MED ORDER — FUROSEMIDE 10 MG/ML IJ SOLN
80.0000 mg | Freq: Two times a day (BID) | INTRAMUSCULAR | Status: DC
  Administered 2014-06-21 – 2014-06-22 (×4): 80 mg via INTRAVENOUS
  Filled 2014-06-20 (×4): qty 8

## 2014-06-20 MED ORDER — CEFTRIAXONE SODIUM 1 G IJ SOLR
1.0000 g | Freq: Once | INTRAMUSCULAR | Status: AC
  Administered 2014-06-20: 1 g via INTRAVENOUS
  Filled 2014-06-20: qty 10

## 2014-06-20 MED ORDER — ASPIRIN 81 MG PO CHEW
81.0000 mg | CHEWABLE_TABLET | Freq: Every day | ORAL | Status: DC
  Administered 2014-06-20 – 2014-06-22 (×3): 81 mg via ORAL
  Filled 2014-06-20 (×3): qty 1

## 2014-06-20 MED ORDER — SIMVASTATIN 20 MG PO TABS
40.0000 mg | ORAL_TABLET | Freq: Every day | ORAL | Status: DC
  Administered 2014-06-20 – 2014-06-21 (×2): 40 mg via ORAL
  Filled 2014-06-20 (×2): qty 2

## 2014-06-20 MED ORDER — INSULIN ASPART 100 UNIT/ML ~~LOC~~ SOLN
0.0000 [IU] | Freq: Every day | SUBCUTANEOUS | Status: DC
  Administered 2014-06-21 – 2014-06-22 (×2): 2 [IU] via SUBCUTANEOUS

## 2014-06-20 MED ORDER — CEFTRIAXONE SODIUM IN DEXTROSE 20 MG/ML IV SOLN
1.0000 g | INTRAVENOUS | Status: DC
  Filled 2014-06-20: qty 50

## 2014-06-20 MED ORDER — CLOPIDOGREL BISULFATE 75 MG PO TABS
75.0000 mg | ORAL_TABLET | Freq: Every morning | ORAL | Status: DC
  Administered 2014-06-21 – 2014-06-23 (×3): 75 mg via ORAL
  Filled 2014-06-20 (×3): qty 1

## 2014-06-20 MED ORDER — AMITRIPTYLINE HCL 25 MG PO TABS
100.0000 mg | ORAL_TABLET | Freq: Every day | ORAL | Status: DC
  Administered 2014-06-20: 100 mg via ORAL
  Filled 2014-06-20: qty 4

## 2014-06-20 MED ORDER — VITAMIN D 1000 UNITS PO TABS
2000.0000 [IU] | ORAL_TABLET | Freq: Every morning | ORAL | Status: DC
  Administered 2014-06-21 – 2014-06-23 (×3): 2000 [IU] via ORAL
  Filled 2014-06-20 (×3): qty 2

## 2014-06-20 MED ORDER — GLYBURIDE MICRONIZED 3 MG PO TABS
6.0000 mg | ORAL_TABLET | Freq: Every day | ORAL | Status: DC
  Administered 2014-06-21: 6 mg via ORAL
  Filled 2014-06-20 (×2): qty 2

## 2014-06-20 MED ORDER — ISOSORBIDE MONONITRATE ER 60 MG PO TB24
30.0000 mg | ORAL_TABLET | Freq: Every day | ORAL | Status: DC
  Administered 2014-06-20 – 2014-06-22 (×3): 30 mg via ORAL
  Filled 2014-06-20 (×3): qty 1

## 2014-06-20 MED ORDER — ONDANSETRON HCL 4 MG/2ML IJ SOLN: 4.0000 mg | Freq: Four times a day (QID) | INTRAMUSCULAR | Status: DC | PRN

## 2014-06-20 MED ORDER — CLONIDINE HCL 0.2 MG PO TABS
0.3000 mg | ORAL_TABLET | Freq: Two times a day (BID) | ORAL | Status: DC
  Administered 2014-06-20 – 2014-06-23 (×6): 0.3 mg via ORAL
  Filled 2014-06-20 (×12): qty 1

## 2014-06-20 MED ORDER — CARVEDILOL 12.5 MG PO TABS: 25.0000 mg | ORAL_TABLET | Freq: Two times a day (BID) | ORAL | Status: DC

## 2014-06-20 MED ORDER — INSULIN GLARGINE 100 UNIT/ML ~~LOC~~ SOLN
20.0000 [IU] | Freq: Every day | SUBCUTANEOUS | Status: DC
  Administered 2014-06-21 – 2014-06-22 (×2): 20 [IU] via SUBCUTANEOUS
  Filled 2014-06-20 (×2): qty 0.2

## 2014-06-20 MED ORDER — INSULIN ASPART 100 UNIT/ML ~~LOC~~ SOLN
0.0000 [IU] | Freq: Three times a day (TID) | SUBCUTANEOUS | Status: DC
  Administered 2014-06-21: 1 [IU] via SUBCUTANEOUS
  Administered 2014-06-21: 2 [IU] via SUBCUTANEOUS
  Administered 2014-06-22 – 2014-06-23 (×2): 1 [IU] via SUBCUTANEOUS

## 2014-06-20 MED ORDER — SODIUM CHLORIDE 0.9 % IJ SOLN
3.0000 mL | Freq: Two times a day (BID) | INTRAMUSCULAR | Status: DC
  Administered 2014-06-20 – 2014-06-21 (×3): 3 mL via INTRAVENOUS

## 2014-06-20 MED ORDER — VITAMIN C 500 MG PO TABS
500.0000 mg | ORAL_TABLET | Freq: Every day | ORAL | Status: DC
  Administered 2014-06-21 – 2014-06-23 (×3): 500 mg via ORAL
  Filled 2014-06-20 (×3): qty 1

## 2014-06-20 MED ORDER — NABUMETONE 750 MG PO TABS: 750.0000 mg | ORAL_TABLET | Freq: Two times a day (BID) | ORAL | Status: DC

## 2014-06-20 MED ORDER — LEVOTHYROXINE SODIUM 75 MCG PO TABS
150.0000 ug | ORAL_TABLET | Freq: Every day | ORAL | Status: DC
  Administered 2014-06-21 – 2014-06-23 (×3): 150 ug via ORAL
  Filled 2014-06-20 (×3): qty 2

## 2014-06-20 MED ORDER — ISOSORBIDE MONONITRATE ER 60 MG PO TB24
60.0000 mg | ORAL_TABLET | Freq: Every day | ORAL | Status: DC
  Administered 2014-06-21 – 2014-06-23 (×3): 60 mg via ORAL
  Filled 2014-06-20 (×3): qty 1

## 2014-06-20 MED ORDER — DEXTROSE 5 % IV SOLN: 1.0000 g | INTRAVENOUS | Status: DC

## 2014-06-20 MED ORDER — ISOSORBIDE MONONITRATE ER 30 MG PO TB24: 30.0000 mg | ORAL_TABLET | Freq: Two times a day (BID) | ORAL | Status: DC

## 2014-06-20 MED ORDER — CARVEDILOL 12.5 MG PO TABS
25.0000 mg | ORAL_TABLET | Freq: Two times a day (BID) | ORAL | Status: DC
  Administered 2014-06-20 – 2014-06-23 (×6): 25 mg via ORAL
  Filled 2014-06-20 (×6): qty 2

## 2014-06-20 MED ORDER — FUROSEMIDE 80 MG PO TABS: 80.0000 mg | ORAL_TABLET | Freq: Two times a day (BID) | ORAL | Status: DC

## 2014-06-20 NOTE — Telephone Encounter (Signed)
Edema in feet/ankles & into legs x 4-5 days.  Weight gain of about 8 pounds in the last 6 days.  Taking Lasix 80mg  - one in the am, one at 3, & 1/2 tab in the evening.  Coughing with laying flat.  Will send message to Dr. Domenic Polite for further advice.

## 2014-06-20 NOTE — Telephone Encounter (Signed)
Difficult management situation. She has diastolic heart failure as well as chronic kidney disease that has been worsening. BUN and creatinine 48 and 3.0 respectively in March. She is already on high-dose Lasix. If she is having worsening heart failure symptoms and weight gain, best option might be hospitalization for IV diuretic and nephrology consultation. Recommend that she be seen in the ER.

## 2014-06-20 NOTE — Telephone Encounter (Signed)
Pt is having extremity swelling

## 2014-06-20 NOTE — H&P (Signed)
[facility] History and Physical  [name] NOM:[id number] DOB: 21-Nov-1945 DOA: 06/20/2014  Referring physician: ER, Dr. Leonides Schanz PCP: Octavio Graves, DO   Chief Complaint: Cough dry.  HPI: [name] is a 69 y.o. female  This is a 69 year old lady who gives a one-week history of a dry cough. She also has noticed swelling of her legs bilaterally for the last couple of days. She denies any dyspnea, PND or orthopnea. She has no chest pain or palpitations. She has not been feverish. There is no nausea, vomiting or abdominal pain. This lady has a history of melanoma in the groin area that was excised several years ago. She was told it was an aggressive type and there was a likelihood of recurrence. Lymph nodes were taken at the time and apparently were clear of disease. Evaluation in the emergency room with CT scan of the chest is suggestive multiple areas of nodules possibly infective in origin with also bilateral pleural effusions. She also describes at least a one-year history where she has not walked. She is now wheelchair-bound. She has not had any investigation of her inability to walk and apparently has not had any physical therapy either. She is now being admitted for further management.   Review of Systems:  Apart from symptoms above, all systems negative.  Past Medical History  Diagnosis Date  . Fibromyalgia   . Hypothyroidism   . Diabetes mellitus, type 2   . Coronary atherosclerosis of native coronary artery     BMS circ/RCA 2001, BMS RCA 2003, PTCA RCA/ramus 2004, DES RCA 2005, DES RCA 2007, DES OM/LAD 2009, DES RCA 7/11, LVEF 65%  . Hyperlipidemia   . Essential hypertension, benign   . MI (myocardial infarction)   . Melanoma    Past Surgical History  Procedure Laterality Date  . Cholecystectomy    . Total abdominal hysterectomy    . Bladder suspension    . Neuroplasty / transposition median nerve at carpal tunnel bilateral    . Coronary stent placement      . Tumor removal     Social History:  reports that she has never smoked. She has never used smokeless tobacco. She reports that she does not drink alcohol or use illicit drugs.  Allergies  Allergen Reactions  . Sulfonamide Derivatives Swelling    REACTION: throat swelling    Family History  Problem Relation Age of Onset  . Coronary artery disease      Prior to Admission medications   Medication Sig Start Date End Date Taking? Authorizing Provider  amitriptyline (ELAVIL) 10 MG tablet Take 30 mg by mouth at bedtime. Takes in addition to the 100mg  at bedtime.   Yes Historical Provider, MD  amitriptyline (ELAVIL) 100 MG tablet Take 100 mg by mouth at bedtime.     Yes Historical Provider, MD  amLODipine (NORVASC) 10 MG tablet Take 10 mg by mouth daily.     Yes Historical Provider, MD  aspirin 81 MG tablet Take 81 mg by mouth at bedtime.    Yes Historical Provider, MD  carvedilol (COREG) 25 MG tablet Take 25 mg by mouth 2 (two) times daily.     Yes Historical Provider, MD  Cholecalciferol (VITAMIN D) 2000 UNITS CAPS Take 1 capsule by mouth every morning.    Yes Historical Provider, MD  cloNIDine (CATAPRES) 0.3 MG tablet Take 0.3 mg by mouth 2 (two) times daily.     Yes Historical Provider, MD  clopidogrel (PLAVIX) 75 MG tablet Take  75 mg by mouth every morning.    Yes Historical Provider, MD  furosemide (LASIX) 80 MG tablet Take 80 mg by mouth 2 (two) times daily.    Yes Historical Provider, MD  glyBURIDE micronized (GLYNASE) 6 MG tablet Take 6 mg by mouth daily.    Yes Historical Provider, MD  hydrALAZINE (APRESOLINE) 25 MG tablet Take 25 mg by mouth 3 (three) times daily.   Yes Satira Sark, MD  insulin glargine (LANTUS) 100 UNIT/ML injection Inject 20-30 Units into the skin at bedtime. *Based on blood sugar levels   Yes Historical Provider, MD  insulin glulisine (APIDRA) 100 UNIT/ML injection Inject 5 Units into the skin 2 (two) times daily after a meal. After breakfast & lunch.   Yes  Historical Provider, MD  isosorbide mononitrate (IMDUR) 60 MG 24 hr tablet TAKE 60MG  IN THE AM AND 30MG  IN THE PM (TAKE HALF TABLET IF SCORED) Patient taking differently: Take 30-60 mg by mouth 2 (two) times daily. TAKE 60MG  IN THE AM AND 30MG  IN THE PM (TAKE HALF TABLET IF SCORED) 12/22/12  Yes Satira Sark, MD  levothyroxine (SYNTHROID, LEVOTHROID) 150 MCG tablet Take 150 mcg by mouth daily before breakfast.   Yes Historical Provider, MD  nabumetone (RELAFEN) 750 MG tablet Take 750 mg by mouth 2 (two) times daily.     Yes Historical Provider, MD  nitroGLYCERIN (NITROSTAT) 0.4 MG SL tablet Place 0.4 mg under the tongue every 5 (five) minutes as needed.     Yes Historical Provider, MD  Omega-3 Fatty Acids (FISH OIL) 1000 MG CAPS Take 2-3 capsules by mouth 2 (two) times daily. Take 3 in am and 2 at bedtime   Yes Historical Provider, MD  polyethylene glycol powder (MIRALAX) powder Take 17 g by mouth daily as needed for mild constipation. In 8 oz of water or juice   Yes Historical Provider, MD  simvastatin (ZOCOR) 40 MG tablet Take 40 mg by mouth at bedtime.    Yes Historical Provider, MD  valsartan (DIOVAN) 320 MG tablet Take 320 mg by mouth every morning.    Yes Historical Provider, MD  vitamin C (ASCORBIC ACID) 500 MG tablet Take 500 mg by mouth daily.     Yes Historical Provider, MD  vitamin E 400 UNIT capsule Take 400 Units by mouth daily.     Yes Historical Provider, MD   Physical Exam: Filed Vitals:   06/20/14 1700 06/20/14 1715 06/20/14 1730 06/20/14 1804  BP: 155/63  143/86 143/86  Pulse: 58 56 62 62  Temp:    98.5 F (36.9 C)  TempSrc:    Oral  Resp: 16 15 18 18   Height:      Weight:      SpO2: 95% 94% 94% 96%    Wt Readings from Last 3 Encounters:  06/20/14 81.647 kg (180 lb)  01/31/14 75.805 kg (167 lb 1.9 oz)  07/22/13 81.647 kg (180 lb)    General:  Appears calm and comfortable Eyes: PERRL, normal lids, irises & conjunctiva ENT: grossly normal hearing, lips &  tongue Neck: no LAD, masses or thyromegaly Cardiovascular: RRR,There is bilateral pitting edema of the legs. Telemetry: SR, no arrhythmias  Respiratorbilateral inspiratory crackles more on the left than the right. No bronchial breathing. Adomen: soft, ntnd Skin: no rash or induration seen on limited exam Musculoskeletal: grossly normal tone BUE/BLE Psychiatric: grossly normal mood and affect, speech fluent and appropriate Neurologic: grossly non-focal.          Labs  on Admission:  Basic Metabolic Panel:  Recent Labs Lab 06/20/14 1540  NA 138  K 3.5  CL 104  CO2 24  GLUCOSE 139*  BUN 55*  CREATININE 3.36*  CALCIUM 9.0   Liver Function Tests: No results for input(s): AST, ALT, ALKPHOS, BILITOT, PROT, ALBUMIN in the last 168 hours. No results for input(s): LIPASE, AMYLASE in the last 168 hours. No results for input(s): AMMONIA in the last 168 hours. CBC:  Recent Labs Lab 06/20/14 1540  WBC 5.9  NEUTROABS 3.7  HGB 10.7*  HCT 32.9*  MCV 97.1  PLT 225   Cardiac Enzymes:  Recent Labs Lab 06/20/14 1540  TROPONINI <0.03    BNP (last 3 results)  Recent Labs  06/20/14 1540  BNP 267.0*    ProBNP (last 3 results) No results for input(s): PROBNP in the last 8760 hours.  CBG: No results for input(s): GLUCAP in the last 168 hours.  Radiological Exams on Admission: Dg Chest 2 View  06/20/2014   CLINICAL DATA:  Edema in feet, ankles and legs for 4-5 days, 8 lb weight gain in past 6 days, coughing when lying flat, history type II diabetes, MI, essential benign hypertension, history melanoma  EXAM: CHEST  2 VIEW  COMPARISON:  12/14/2012  FINDINGS: Normal heart size, mediastinal contours, and pulmonary vascularity.  Question minimal infiltrate in both upper lobes and at LEFT base.  No pleural effusion or pneumothorax.  Bones unremarkable.  IMPRESSION: Suspect minimal BILATERAL upper lobe and questionable LEFT basilar infiltrates concerning for pneumonia.  In light of  history of melanoma, radiographic followup recommended in 3-4 weeks recommended to ensure resolution.   Electronically Signed   By: Lavonia Dana M.D.   On: 06/20/2014 16:32   Ct Chest Wo Contrast  06/20/2014   CLINICAL DATA:  Chest congestion, cough.  History of melanoma.  EXAM: CT CHEST WITHOUT CONTRAST  TECHNIQUE: Multidetector CT imaging of the chest was performed following the standard protocol without IV contrast.  COMPARISON:  Chest radiograph of same day.  FINDINGS: No pneumothorax is noted. Minimal bilateral pleural effusions are noted with left greater than right. Multiple nodular and airspace opacities are noted throughout both lungs. Largest nodular density measuring 11 mm is noted in superior segment of right lower lobe. 8 mm nodule is noted medial and inferior to this. Ill-defined airspace opacity is noted anteriorly in the left upper lobe as well as in right lung apex. Ill-defined nodular opacity is seen anterior to left major fissure in left upper lobe, with several smaller nodular densities seen laterally in the left upper lobe. Large airspace opacity is noted laterally in right upper lobe most consistent with inflammation. Is uncertain if these findings represent multifocal pneumonia, or possibly metastatic disease given the history of melanoma. Coronary artery calcifications are noted suggesting coronary artery disease. Atherosclerosis of thoracic aorta is noted without aneurysm formation. No significant mediastinal mass or adenopathy is noted. Visualized portion of upper abdomen appears normal. No significant osseous abnormality is noted in the chest.  IMPRESSION: Minimal bilateral pleural effusions with left greater than right.  Multiple nodular and airspace opacities are noted throughout both lung which may represent multifocal pneumonia, but metastatic disease cannot be excluded given the history of melanoma. Follow-up CT scan in 2-3 weeks is recommended to ensure resolution of these  abnormalities. If these abnormalities persist on followup CT scan, this would be suspicious for metastatic disease.   Electronically Signed   By: Marijo Conception, M.D.   On:  06/20/2014 18:05      Assessment/Plan   1. Cough. Etiology is not entirely clear to me. She could have pneumonia but she does not have a fever, white count or does not look toxic. This could represent fluid overload and congestive heart failure. We will treat with intravenous anti-biotics and IV Lasix. However also, this could represent metastatic disease from melanoma. I think she probably will require repeat chest x-ray or/and CT chest scan in 2-3 weeks. 2. Leg weakness. The etiology is not clear to me. I will order MRI lumbar spine. I will ask neurology consultation. Physical therapy. 3. Hypertension. Stable. 4. Chronic kidney disease. Nephrology consultation. 5. Diabetes. Continue with home medications and sliding scale of insulin.   Further recommendations will depend on patient's hospital progress.   Code Status: Full code.  DVT Prophylaxis: heparin.  Family Communication: I discussed the plan with the patient at the bedside.   Disposition Plan: home when medically stable.   Time spent: 60 minutes.   Doree Albee [facility] Pager [phone].

## 2014-06-20 NOTE — Progress Notes (Signed)
Patient is scheduled for an MRI, family states that she needs something for anxiety in order to do MRI. Paged on-call admitting MD, will follow any new orders given.

## 2014-06-20 NOTE — ED Notes (Signed)
Patient transported to X-ray 

## 2014-06-20 NOTE — ED Provider Notes (Signed)
TIME SEEN: 3:30 PM  CHIEF COMPLAINT: Lower extremity swelling, cough  HPI: Pt is a 69 y.o. female with history of coronary artery disease status post 13 stents, hyperlipidemia, hypertension, diastolic heart failure, chronic kidney disease who presents to the emergency department with increased lower extremity swelling for the past several days, coughing and feeling like something is "in my chest". She states she is not coughing anything up. No fever. No new chest pain or chest discomfort. Denies any new shortness of breath. State she does not lie flat at night but instead sleeps in a recliner but states this is her baseline. She does not ambulate at baseline, uses a wheelchair. She takes Lasix 80 mg in the morning, 80 mg in the afternoon and 40 mg at bedtime. She has not missed any doses of her Lasix. Denies any changes in her diet. She is not sure if she has gained weight.  ROS: See HPI Constitutional: no fever  Eyes: no drainage  ENT: no runny nose   Cardiovascular:  no chest pain  Resp: Chronic SOB  GI: no vomiting GU: no dysuria Integumentary: no rash  Allergy: no hives  Musculoskeletal: no leg swelling  Neurological: no slurred speech ROS otherwise negative  PAST MEDICAL HISTORY/PAST SURGICAL HISTORY:  Past Medical History  Diagnosis Date  . Fibromyalgia   . Hypothyroidism   . Diabetes mellitus, type 2   . Coronary atherosclerosis of native coronary artery     BMS circ/RCA 2001, BMS RCA 2003, PTCA RCA/ramus 2004, DES RCA 2005, DES RCA 2007, DES OM/LAD 2009, DES RCA 7/11, LVEF 65%  . Hyperlipidemia   . Essential hypertension, benign   . MI (myocardial infarction)   . Melanoma     MEDICATIONS:  Prior to Admission medications   Medication Sig Start Date End Date Taking? Authorizing Provider  amitriptyline (ELAVIL) 10 MG tablet Take 30 mg by mouth at bedtime. Takes in addition to the 100mg  at bedtime.    Historical Provider, MD  amitriptyline (ELAVIL) 100 MG tablet Take 100 mg  by mouth at bedtime.      Historical Provider, MD  amLODipine (NORVASC) 10 MG tablet Take 10 mg by mouth daily.      Historical Provider, MD  aspirin 81 MG tablet Take 81 mg by mouth daily.      Historical Provider, MD  carvedilol (COREG) 25 MG tablet Take 25 mg by mouth 2 (two) times daily.      Historical Provider, MD  Cholecalciferol (VITAMIN D) 2000 UNITS CAPS Take 1 capsule by mouth daily.    Historical Provider, MD  cloNIDine (CATAPRES) 0.3 MG tablet Take 0.3 mg by mouth 2 (two) times daily.      Historical Provider, MD  clopidogrel (PLAVIX) 75 MG tablet Take 75 mg by mouth daily.      Historical Provider, MD  furosemide (LASIX) 80 MG tablet Take 80 mg by mouth 2 (two) times daily.     Historical Provider, MD  glyBURIDE micronized (GLYNASE) 6 MG tablet Take 6 mg by mouth daily.     Historical Provider, MD  hydrALAZINE (APRESOLINE) 25 MG tablet Take 25 mg by mouth 3 (three) times daily.    Satira Sark, MD  insulin glargine (LANTUS) 100 UNIT/ML injection Inject 20-30 Units into the skin at bedtime.     Historical Provider, MD  insulin glulisine (APIDRA) 100 UNIT/ML injection Inject 5 Units into the skin. After breakfast & lunch.    Historical Provider, MD  isosorbide mononitrate (IMDUR) 60  MG 24 hr tablet TAKE 60MG  IN THE AM AND 30MG  IN THE PM (TAKE HALF TABLET IF SCORED) 12/22/12   Satira Sark, MD  levothyroxine (SYNTHROID, LEVOTHROID) 150 MCG tablet Take 150 mcg by mouth daily before breakfast.    Historical Provider, MD  nabumetone (RELAFEN) 750 MG tablet Take 750 mg by mouth 2 (two) times daily.      Historical Provider, MD  nitroGLYCERIN (NITROSTAT) 0.4 MG SL tablet Place 0.4 mg under the tongue every 5 (five) minutes as needed.      Historical Provider, MD  Omega-3 Fatty Acids (FISH OIL) 1000 MG CAPS Take by mouth. Take 3 in am and 2 at bedtime     Historical Provider, MD  polyethylene glycol powder (MIRALAX) powder Take 17 g by mouth daily as needed for mild constipation. In 8  oz of water or juice    Historical Provider, MD  potassium chloride SA (K-DUR,KLOR-CON) 20 MEQ tablet Take 20 mEq by mouth daily.      Historical Provider, MD  simvastatin (ZOCOR) 40 MG tablet Take 40 mg by mouth daily.    Historical Provider, MD  valsartan (DIOVAN) 320 MG tablet Take 320 mg by mouth daily.      Historical Provider, MD  vitamin C (ASCORBIC ACID) 500 MG tablet Take 500 mg by mouth daily.      Historical Provider, MD  vitamin E 400 UNIT capsule Take 400 Units by mouth daily.      Historical Provider, MD    ALLERGIES:  Allergies  Allergen Reactions  . Sulfonamide Derivatives     REACTION: throat swelling    SOCIAL HISTORY:  History  Substance Use Topics  . Smoking status: Never Smoker   . Smokeless tobacco: Never Used     Comment: tobacco use - no  . Alcohol Use: No    FAMILY HISTORY: Family History  Problem Relation Age of Onset  . Coronary artery disease      EXAM: BP 136/69 mmHg  Pulse 58  Temp(Src) 98.8 F (37.1 C) (Oral)  Resp 16  Ht 5\' 3"  (1.6 m)  Wt 180 lb (81.647 kg)  BMI 31.89 kg/m2  SpO2 97% CONSTITUTIONAL: Alert and oriented and responds appropriately to questions. Well-appearing; well-nourished HEAD: Normocephalic EYES: Conjunctivae clear, PERRL ENT: normal nose; no rhinorrhea; moist mucous membranes; pharynx without lesions noted NECK: Supple, no meningismus, no LAD, difficult to appreciate JVD given size of patient's neck  CARD: RRR; S1 and S2 appreciated; no murmurs, no clicks, no rubs, no gallops RESP: Normal chest excursion without splinting or tachypnea; breath sounds equal bilaterally; no wheezes, no rhonchi, patient does have bibasilar Rales, no hypoxia or respiratory distress, speaking full sentences ABD/GI: Normal bowel sounds; non-distended; soft, non-tender, no rebound, no guarding, no peritoneal signs BACK:  The back appears normal and is non-tender to palpation, there is no CVA tenderness EXT: Normal ROM in all joints;  non-tender to palpation; nonpitting edema to the feet and to the level of the midcalf bilaterally; normal capillary refill; no cyanosis, no calf tenderness SKIN: Normal color for age and race; warm NEURO: Moves all extremities equally, sensation to light touch intact diffusely, cranial nerves II through XII intact PSYCH: The patient's mood and manner are appropriate. Grooming and personal hygiene are appropriate.  MEDICAL DECISION MAKING: Patient here with signs of volume overload. Has the reports he called her cardiologist Dr. Domenic Polite and they recommended she be evaluated in the emergency department. She is known to have worsening chronic kidney  disease and his artery on my doses of direct at home yet symptoms are continuing to worsen. We'll obtain cardiac labs,  chest x-ray. We'll give Lasix 80 mg IV in the ED.  ED PROGRESS: Patient's labs show briskly worsening kidney function. Creatinine is 3.36. Her BNP is mildly elevated at 267. Troponin negative. Chest x-ray shows bilateral upper lobe and left basilar infiltrates concerning for pneumonia. We'll treat for community-acquired pneumonia with ceftriaxone and azithromycin. She does report feeling chest congestion and has a cough. No fever or leukocytosis. Will admit to hospitalist service.   4:50 PM  D/w Dr. Anastasio Champion for admission to a telemetry bed. He is requesting a CT of her chest without contrast for further evaluation to see if this is pneumonia versus edema. He agrees with giving antibiotics now.    EKG Interpretation  Date/Time:  Monday June 20 2014 17:04:09 EDT Ventricular Rate:  60 PR Interval:    QRS Duration: 82 QT Interval:  435 QTC Calculation: 435 R Axis:   42 Text Interpretation:  Sinus bradycardia Borderline low voltage, extremity leads Nonspecific T abnormalities, lateral leads Baseline wander in lead(s) V3 No significant change since Dec 2014 Confirmed by Syniyah Bourne,  DO, Talicia Sui [id number]) on 06/20/2014 5:18:03 PM         [place], DO 06/20/14 1718

## 2014-06-20 NOTE — ED Notes (Signed)
Patient complaining of swelling in bilateral feet and legs since Wednesday. States she called Dr Domenic Polite and was told to bring her to ER to be admitted.

## 2014-06-20 NOTE — Telephone Encounter (Signed)
Husband Jeneen Rinks notified & verbalized understanding.  He will take her to [facility].

## 2014-06-21 ENCOUNTER — Inpatient Hospital Stay (HOSPITAL_COMMUNITY): Payer: Medicare Other

## 2014-06-21 DIAGNOSIS — Z85820 Personal history of malignant melanoma of skin: Secondary | ICD-10-CM

## 2014-06-21 LAB — COMPREHENSIVE METABOLIC PANEL
ALBUMIN: 2.3 g/dL — AB (ref 3.5–5.0)
ALK PHOS: 32 U/L — AB (ref 38–126)
ALT: 21 U/L (ref 14–54)
ANION GAP: 8 (ref 5–15)
AST: 18 U/L (ref 15–41)
BUN: 50 mg/dL — ABNORMAL HIGH (ref 6–20)
CO2: 25 mmol/L (ref 22–32)
Calcium: 8.6 mg/dL — ABNORMAL LOW (ref 8.9–10.3)
Chloride: 107 mmol/L (ref 101–111)
Creatinine, Ser: 3.13 mg/dL — ABNORMAL HIGH (ref 0.44–1.00)
GFR calc Af Amer: 16 mL/min — ABNORMAL LOW (ref >60)
GFR calc non Af Amer: 14 mL/min — ABNORMAL LOW (ref >60)
Glucose, Bld: 86 mg/dL (ref 65–99)
POTASSIUM: 3.3 mmol/L — AB (ref 3.5–5.1)
Sodium: 140 mmol/L (ref 135–145)
TOTAL PROTEIN: 5.6 g/dL — AB (ref 6.5–8.1)
Total Bilirubin: 1.4 mg/dL — ABNORMAL HIGH (ref 0.3–1.2)

## 2014-06-21 LAB — CBC
HCT: 29.4 % — ABNORMAL LOW (ref 36.0–46.0)
Hemoglobin: 9.7 g/dL — ABNORMAL LOW (ref 12.0–15.0)
MCH: 32 pg (ref 26.0–34.0)
MCHC: 33 g/dL (ref 30.0–36.0)
MCV: 97 fL (ref 78.0–100.0)
Platelets: 210 10*3/uL (ref 150–400)
RBC: 3.03 MIL/uL — ABNORMAL LOW (ref 3.87–5.11)
RDW: 13.2 % (ref 11.5–15.5)
WBC: 5.1 10*3/uL (ref 4.0–10.5)

## 2014-06-21 LAB — GLUCOSE, CAPILLARY
GLUCOSE-CAPILLARY: 102 mg/dL — AB (ref 65–99)
GLUCOSE-CAPILLARY: 130 mg/dL — AB (ref 65–99)
Glucose-Capillary: 169 mg/dL — ABNORMAL HIGH (ref 65–99)
Glucose-Capillary: 232 mg/dL — ABNORMAL HIGH (ref 65–99)

## 2014-06-21 MED ORDER — LORAZEPAM 1 MG PO TABS
1.0000 mg | ORAL_TABLET | Freq: Once | ORAL | Status: AC
  Administered 2014-06-21: 1 mg via ORAL
  Filled 2014-06-21: qty 1

## 2014-06-21 MED ORDER — DEXTROSE 5 % IV SOLN
1.0000 g | INTRAVENOUS | Status: DC
  Administered 2014-06-21 – 2014-06-22 (×2): 1 g via INTRAVENOUS
  Filled 2014-06-21 (×3): qty 10

## 2014-06-21 MED ORDER — AMITRIPTYLINE HCL 25 MG PO TABS
130.0000 mg | ORAL_TABLET | Freq: Every day | ORAL | Status: DC
  Administered 2014-06-21 – 2014-06-22 (×2): 130 mg via ORAL
  Filled 2014-06-21 (×2): qty 3
  Filled 2014-06-21 (×2): qty 4

## 2014-06-21 NOTE — Care Management Note (Signed)
Case Management Note  Patient Details  Name: [name] MRN: [id number] Date of Birth: Aug 10, 1945  Subjective/Objective:                  Pt admitted from home with pneumonia and fluid overload. Pt lives with her husband but husband is thinking pt will need SNF at discharge. Pt has a walker and w/c for home use. Pts husband does have to provide moderate assistance with ADL's.  Action/Plan: Awaiting PT consult and recommendations. CSW is aware that pt may need SNF at discharge.  Expected Discharge Date:  06/24/14               Expected Discharge Plan:  Skilled Nursing Facility  In-House Referral:  Clinical Social Work  Discharge planning Services  CM Consult  Post Acute Care Choice:    Choice offered to:     DME Arranged:    DME Agency:     HH Arranged:    [place] Agency:     Status of Service:  In process, will continue to follow  Medicare Important Message Given:    Date Medicare IM Given:    Medicare IM give by:    Date Additional Medicare IM Given:    Additional Medicare Important Message give by:     If discussed at [place] of Stay Meetings, dates discussed:    Additional Comments:  Joylene Draft, RN 06/21/2014, 11:04 AM

## 2014-06-21 NOTE — Clinical Social Work Placement (Signed)
   CLINICAL SOCIAL WORK PLACEMENT  NOTE  Date:  06/21/2014  Patient Details  Name: [name] MRN: [id number] Date of Birth: February 13, 1945  Clinical Social Work is seeking post-discharge placement for this patient at the [place] level of care (*CSW will initial, date and re-position this form in  chart as items are completed):  Yes   Patient/family provided with [facility] Work Department's list of facilities offering this level of care within the geographic area requested by the patient (or if unable, by the patient's family).  Yes   Patient/family informed of their freedom to choose among providers that offer the needed level of care, that participate in Medicare, Medicaid or managed care program needed by the patient, have an available bed and are willing to accept the patient.  Yes   Patient/family informed of [facility]'s ownership interest in [facility] and [facility], as well as of the fact that they are under no obligation to receive care at these facilities.  PASRR submitted to EDS on       PASRR number received on       Existing PASRR number confirmed on       FL2 transmitted to all facilities in geographic area requested by pt/family on 06/21/14     FL2 transmitted to all facilities within larger geographic area on       Patient informed that his/her managed care company has contracts with or will negotiate with certain facilities, including the following:            Patient/family informed of bed offers received.  Patient chooses bed at       Physician recommends and patient chooses bed at      Patient to be transferred to   on  .  Patient to be transferred to facility by       Patient family notified on   of transfer.  Name of family member notified:        PHYSICIAN       Additional Comment:  No pasarr required per [place] facilities.   _______________________________________________ Salome Arnt,  [place] 06/21/2014, 2:45 PM [phone]

## 2014-06-21 NOTE — Consult Note (Signed)
[name] MRN: [id number] DOB/AGE: 69/30/1947 69 y.o. Primary Care Physician:BUTLER, CYNTHIA, DO Admit date: 06/20/2014 Chief Complaint:  Chief Complaint  Patient presents with  . Leg Swelling   HPI:Pt is 69 year old female with past medical hx of DM who came to ER with c/o Leg sweling.  HPI dates back to 1 week when pt started having LE swelling, it was progressive. Pt also c/o weight gain of around 8 lbs. Pt was being tried on oral diuretics but pt did not respond so she came to ER. Pt also c/o cough, non productive Pt c/o orthopnea Pt seen today on 3rd floor, pt says she is feeling better than before. NO c/o Nausea/vomiting NO c/o fever.chills NO c/o Dizziness  No c/o hematemesis No c/o blood on stools      Past Medical History  Diagnosis Date  . Fibromyalgia   . Hypothyroidism   . Diabetes mellitus, type 2   . Coronary atherosclerosis of native coronary artery     BMS circ/RCA 2001, BMS RCA 2003, PTCA RCA/ramus 2004, DES RCA 2005, DES RCA 2007, DES OM/LAD 2009, DES RCA 7/11, LVEF 65%  . Hyperlipidemia   . Essential hypertension, benign   . MI (myocardial infarction)   . Melanoma         Family History  Problem Relation Age of Onset  . Coronary artery disease      Social History:  reports that she has never smoked. She has never used smokeless tobacco. She reports that she does not drink alcohol or use illicit drugs.   Allergies:  Allergies  Allergen Reactions  . Sulfonamide Derivatives Swelling    REACTION: throat swelling    Medications Prior to Admission  Medication Sig Dispense Refill  . amitriptyline (ELAVIL) 10 MG tablet Take 30 mg by mouth at bedtime. Takes in addition to the 100mg  at bedtime.    Marland Kitchen amitriptyline (ELAVIL) 100 MG tablet Take 100 mg by mouth at bedtime.      Marland Kitchen amLODipine (NORVASC) 10 MG tablet Take 10 mg by mouth daily.      Marland Kitchen aspirin 81 MG tablet Take 81 mg by mouth at bedtime.     . carvedilol (COREG) 25 MG tablet Take 25  mg by mouth 2 (two) times daily.      . Cholecalciferol (VITAMIN D) 2000 UNITS CAPS Take 1 capsule by mouth every morning.     . cloNIDine (CATAPRES) 0.3 MG tablet Take 0.3 mg by mouth 2 (two) times daily.      . clopidogrel (PLAVIX) 75 MG tablet Take 75 mg by mouth every morning.     . furosemide (LASIX) 80 MG tablet Take 80 mg by mouth 2 (two) times daily.     Marland Kitchen glyBURIDE micronized (GLYNASE) 6 MG tablet Take 6 mg by mouth daily.     . hydrALAZINE (APRESOLINE) 25 MG tablet Take 25 mg by mouth 3 (three) times daily.    . insulin glargine (LANTUS) 100 UNIT/ML injection Inject 20-30 Units into the skin at bedtime. *Based on blood sugar levels    . insulin glulisine (APIDRA) 100 UNIT/ML injection Inject 5 Units into the skin 2 (two) times daily after a meal. After breakfast & lunch.    . isosorbide mononitrate (IMDUR) 60 MG 24 hr tablet TAKE 60MG  IN THE AM AND 30MG  IN THE PM (TAKE HALF TABLET IF SCORED) (Patient taking differently: Take 30-60 mg by mouth 2 (two) times daily. TAKE 60MG  IN THE AM AND 30MG  IN THE  PM (TAKE HALF TABLET IF SCORED)) 135 tablet 6  . levothyroxine (SYNTHROID, LEVOTHROID) 150 MCG tablet Take 150 mcg by mouth daily before breakfast.    . nabumetone (RELAFEN) 750 MG tablet Take 750 mg by mouth 2 (two) times daily.      . nitroGLYCERIN (NITROSTAT) 0.4 MG SL tablet Place 0.4 mg under the tongue every 5 (five) minutes as needed.      . Omega-3 Fatty Acids (FISH OIL) 1000 MG CAPS Take 2-3 capsules by mouth 2 (two) times daily. Take 3 in am and 2 at bedtime    . polyethylene glycol powder (MIRALAX) powder Take 17 g by mouth daily as needed for mild constipation. In 8 oz of water or juice    . simvastatin (ZOCOR) 40 MG tablet Take 40 mg by mouth at bedtime.     . valsartan (DIOVAN) 320 MG tablet Take 320 mg by mouth every morning.     . vitamin C (ASCORBIC ACID) 500 MG tablet Take 500 mg by mouth daily.      . vitamin E 400 UNIT capsule Take 400 Units by mouth daily.            QJJ:HERDE from the symptoms mentioned above,there are no other symptoms referable to all systems reviewed.  Marland Kitchen amitriptyline  100 mg Oral QHS  . amitriptyline  30 mg Oral QHS  . amLODipine  10 mg Oral Daily  . aspirin  81 mg Oral QHS  . azithromycin  500 mg Intravenous Q24H  . carvedilol  25 mg Oral BID WC  . cefTRIAXone (ROCEPHIN)  IV  1 g Intravenous Q24H  . cholecalciferol  2,000 Units Oral q morning - 10a  . cloNIDine  0.3 mg Oral BID  . clopidogrel  75 mg Oral q morning - 10a  . furosemide  80 mg Intravenous BID  . glyBURIDE micronized  6 mg Oral QAC breakfast  . heparin  5,000 Units Subcutaneous 3 times per day  . hydrALAZINE  25 mg Oral TID  . insulin aspart  0-5 Units Subcutaneous QHS  . insulin aspart  0-9 Units Subcutaneous TID WC  . insulin glargine  20 Units Subcutaneous QHS  . irbesartan  300 mg Oral Daily  . isosorbide mononitrate  60 mg Oral Daily   And  . isosorbide mononitrate  30 mg Oral QHS  . levothyroxine  150 mcg Oral QAC breakfast  . omega-3 acid ethyl esters  1 g Oral Daily  . simvastatin  40 mg Oral QHS  . sodium chloride  3 mL Intravenous Q12H  . sodium chloride  3 mL Intravenous Q12H  . vitamin C  500 mg Oral Daily  . vitamin E  400 Units Oral Daily     Physical Exam: Vital signs in last 24 hours: Temp:  [98 F (36.7 C)-98.8 F (37.1 C)] 98.4 F (36.9 C) (06/14 0628) Pulse Rate:  [56-67] 59 (06/14 0628) Resp:  [15-20] 18 (06/13 2203) BP: (136-188)/(55-92) 161/67 mmHg (06/14 0628) SpO2:  [94 %-98 %] 95 % (06/14 0628) Weight:  [173 lb 8 oz (78.7 kg)-180 lb (81.647 kg)] 173 lb 8 oz (78.7 kg) (06/14 0814) Weight change:     Intake/Output from previous day: 06/13 0701 - 06/14 0700 In: -  Out: 1400 [Urine:1400]     Physical Exam: General- pt is awake,alert, oriented to time place and person Resp- No acute REsp distress, Crackles + CVS- S1S2 regular in rate and rhythm GIT- BS+, soft, NT, ND EXT- 1+ LE  Edema, NO Cyanosis CNS-  CN 2-12 grossly intact. Moving all 4 extremities Psych- normal mood and affect    Lab Results: CBC  Recent Labs  06/20/14 1540 06/21/14 0547  WBC 5.9 5.1  HGB 10.7* 9.7*  HCT 32.9* 29.4*  PLT 225 210    BMET  Recent Labs  06/20/14 1540 06/21/14 0547  NA 138 140  K 3.5 3.3*  CL 104 107  CO2 24 25  GLUCOSE 139* 86  BUN 55* 50*  CREATININE 3.36* 3.13*  CALCIUM 9.0 8.6*   Creat Trend 2016 3.3=>3.1        3.0--3.3 ( baseline) 2015 2.8 2014 1.9--2.3 2013 1.6 2012  1.4--1.5   MICRO No results found for this or any previous visit (from the past 240 hour(s)).    Lab Results  Component Value Date   CALCIUM 8.6* 06/21/2014      Impression: 1)Renal  AKI secondary to Cardiorenal               AKi vs CKD progression               CKD stage 4 .               CKD since 2012               CKD secondary to DM/HTN                Progression of CKD  As expected for DM                Proteinura will check.  2)HTN Medication- On RAS blockers. On Diuretics. On Calcium Channel Blockers On Alpha and beta Blockers . On Vasodilators. On [employer].  3)Anemia HGb at goal (9--11)   4)CKD Mineral-Bone Disorder PTH acceptable. Secondary Hyperparathyroidism  present . Phosphorus at goal. Vitamin 25-OH low-on replcement   5)ID-admitted with cough, pneumonia On IV ABx Primary MD following  6)Electrolytes Hypokalemic NOrmonatremic   7)Acid base Co2 at goal     Plan:  Will replete K Will continue IV diuretics Will initiate anemia wok up     [name] S 06/21/2014, 9:53 AM

## 2014-06-21 NOTE — Clinical Social Work Note (Signed)
Clinical Social Work Assessment  Patient Details  Name: [name] MRN: [id number] Date of Birth: [birth year]  Date of referral:  06/21/14               Reason for consult:  Facility Placement                Permission sought to share information with:    Permission granted to share information::     Name::        Agency::     Relationship::     Contact Information:     Housing/Transportation Living arrangements for the past 2 months:  Single Family Home Source of Information:  Patient, Spouse Patient Interpreter Needed:  None Criminal Activity/Legal Involvement Pertinent to Current Situation/Hospitalization:  No - Comment as needed Significant Relationships:  Adult Children, Spouse Lives with:  Spouse Do you feel safe going back to the place where you live?  No (requests rehab before returning home) Need for family participation in patient care:  Yes (Comment)  Care giving concerns:  Pt lives with husband, but requiring a lot of assist right now.    Social Worker assessment / plan:  CSW met with pt, pt's husband, and daughter-in-law at bedside. Pt alert and oriented and reports she came to ED due to fluid build up and persistent cough. Pt being treated for pneumonia. She indicates that she has required assist with most ADLs recently and has started using a wheelchair primarily due to falls. Pt's husband said that she has had at least 4 bad falls in the last few months. Their son and daughter-in-law live next door and are very involved and supportive. PT evaluated pt and feel she would benefit from rehab. CSW discussed placement process and provided SNF list. Request [facility] in [place]. CSW will initiate referral.   Employment status:  Retired Forensic scientist:  Medicare PT Recommendations:  [facility] / Referral to community resources:  [place]  Patient/Family's Response to care:  Pt requesting short term SNF prior to return  home.   Patient/Family's Understanding of and Emotional Response to Diagnosis, Current Treatment, and Prognosis:  Pt and family appear to have understanding of admission diagnosis and medical history. Pt has positive feelings regarding SNF.   Emotional Assessment Appearance:  Appears stated age Attitude/Demeanor/Rapport:  Other (cooperative) Affect (typically observed):  Appropriate Orientation:  Oriented to Self, Oriented to Place, Oriented to  Time, Oriented to Situation Alcohol / Substance use:  Not Applicable Psych involvement (Current and /or in the community):  No (Comment)  Discharge Needs  Concerns to be addressed:  Discharge Planning Concerns Readmission within the last 30 days:  No Current discharge risk:  Physical Impairment Barriers to Discharge:  Continued Medical Work up   [employer], [employer], [place] 06/21/2014, 2:50 PM [phone]

## 2014-06-21 NOTE — Progress Notes (Signed)
[facility] PROGRESS NOTE  [name] NWG:[id number] DOB: 01/14/1945    PCP:   Octavio Graves, DO   HPI: [name] is an 69 y.o. female admitted yesterday by Dr Solon Palm for possible PNA, with coughs, but no fever, or leukocytosis.  She has abnormal CT scan of her chest, being tx for PNA, but concerns for metastatic disease as well, and will need follow up CT after Tx.  She had hx of melanoma, removed from her groin, and had negative nodes at that time.  She also has weakness of the left lower extremity by my exam, and is getting an MRI of her back.  Lastly, she has some bilateral pedal edema, without orthopnea or PND.  She felt better this morning.    Rewiew of Systems:  Constitutional: Negative for malaise, fever and chills. No significant weight loss or weight gain Eyes: Negative for eye pain, redness and discharge, diplopia, visual changes, or flashes of light. ENMT: Negative for ear pain, hoarseness, nasal congestion, sinus pressure and sore throat. No headaches; tinnitus, drooling, or problem swallowing. Cardiovascular: Negative for chest pain, palpitations, diaphoresis, dyspnea and peripheral edema. ; No orthopnea, PND Respiratory: Negative for  hemoptysis, wheezing and stridor. No pleuritic chestpain. Gastrointestinal: Negative for nausea, vomiting, diarrhea, constipation, abdominal pain, melena, blood in stool, hematemesis, jaundice and rectal bleeding.    Genitourinary: Negative for frequency, dysuria, incontinence,flank pain and hematuria; Musculoskeletal: Negative for back pain and neck pain. Negative for swelling and trauma.;  Skin: . Negative for pruritus, rash, abrasions, bruising and skin lesion.; ulcerations Neuro: Negative for headache, lightheadedness and neck stiffness. Negative for weakness, altered level of consciousness , altered mental status, extremity weakness, burning feet, involuntary movement, seizure and syncope.  Psych: negative for anxiety,  depression, insomnia, tearfulness, panic attacks, hallucinations, paranoia, suicidal or homicidal ideation    Past Medical History  Diagnosis Date  . Fibromyalgia   . Hypothyroidism   . Diabetes mellitus, type 2   . Coronary atherosclerosis of native coronary artery     BMS circ/RCA 2001, BMS RCA 2003, PTCA RCA/ramus 2004, DES RCA 2005, DES RCA 2007, DES OM/LAD 2009, DES RCA 7/11, LVEF 65%  . Hyperlipidemia   . Essential hypertension, benign   . MI (myocardial infarction)   . Melanoma     Past Surgical History  Procedure Laterality Date  . Cholecystectomy    . Total abdominal hysterectomy    . Bladder suspension    . Neuroplasty / transposition median nerve at carpal tunnel bilateral    . Coronary stent placement    . Tumor removal      Medications:  HOME MEDS: Prior to Admission medications   Medication Sig Start Date End Date Taking? Authorizing Provider  amitriptyline (ELAVIL) 10 MG tablet Take 30 mg by mouth at bedtime. Takes in addition to the 100mg  at bedtime.   Yes Historical Provider, MD  amitriptyline (ELAVIL) 100 MG tablet Take 100 mg by mouth at bedtime.     Yes Historical Provider, MD  amLODipine (NORVASC) 10 MG tablet Take 10 mg by mouth daily.     Yes Historical Provider, MD  aspirin 81 MG tablet Take 81 mg by mouth at bedtime.    Yes Historical Provider, MD  carvedilol (COREG) 25 MG tablet Take 25 mg by mouth 2 (two) times daily.     Yes Historical Provider, MD  Cholecalciferol (VITAMIN D) 2000 UNITS CAPS Take 1 capsule by mouth every morning.    Yes Historical Provider, MD  cloNIDine (CATAPRES) 0.3 MG tablet Take 0.3 mg by mouth 2 (two) times daily.     Yes Historical Provider, MD  clopidogrel (PLAVIX) 75 MG tablet Take 75 mg by mouth every morning.    Yes Historical Provider, MD  furosemide (LASIX) 80 MG tablet Take 80 mg by mouth 2 (two) times daily.    Yes Historical Provider, MD  glyBURIDE micronized (GLYNASE) 6 MG tablet Take 6 mg by mouth daily.    Yes  Historical Provider, MD  hydrALAZINE (APRESOLINE) 25 MG tablet Take 25 mg by mouth 3 (three) times daily.   Yes Satira Sark, MD  insulin glargine (LANTUS) 100 UNIT/ML injection Inject 20-30 Units into the skin at bedtime. *Based on blood sugar levels   Yes Historical Provider, MD  insulin glulisine (APIDRA) 100 UNIT/ML injection Inject 5 Units into the skin 2 (two) times daily after a meal. After breakfast & lunch.   Yes Historical Provider, MD  isosorbide mononitrate (IMDUR) 60 MG 24 hr tablet TAKE 60MG  IN THE AM AND 30MG  IN THE PM (TAKE HALF TABLET IF SCORED) Patient taking differently: Take 30-60 mg by mouth 2 (two) times daily. TAKE 60MG  IN THE AM AND 30MG  IN THE PM (TAKE HALF TABLET IF SCORED) 12/22/12  Yes Satira Sark, MD  levothyroxine (SYNTHROID, LEVOTHROID) 150 MCG tablet Take 150 mcg by mouth daily before breakfast.   Yes Historical Provider, MD  nabumetone (RELAFEN) 750 MG tablet Take 750 mg by mouth 2 (two) times daily.     Yes Historical Provider, MD  nitroGLYCERIN (NITROSTAT) 0.4 MG SL tablet Place 0.4 mg under the tongue every 5 (five) minutes as needed.     Yes Historical Provider, MD  Omega-3 Fatty Acids (FISH OIL) 1000 MG CAPS Take 2-3 capsules by mouth 2 (two) times daily. Take 3 in am and 2 at bedtime   Yes Historical Provider, MD  polyethylene glycol powder (MIRALAX) powder Take 17 g by mouth daily as needed for mild constipation. In 8 oz of water or juice   Yes Historical Provider, MD  simvastatin (ZOCOR) 40 MG tablet Take 40 mg by mouth at bedtime.    Yes Historical Provider, MD  valsartan (DIOVAN) 320 MG tablet Take 320 mg by mouth every morning.    Yes Historical Provider, MD  vitamin C (ASCORBIC ACID) 500 MG tablet Take 500 mg by mouth daily.     Yes Historical Provider, MD  vitamin E 400 UNIT capsule Take 400 Units by mouth daily.     Yes Historical Provider, MD     Allergies:  Allergies  Allergen Reactions  . Sulfonamide Derivatives Swelling    REACTION:  throat swelling    Social History:   reports that she has never smoked. She has never used smokeless tobacco. She reports that she does not drink alcohol or use illicit drugs.  Family History: Family History  Problem Relation Age of Onset  . Coronary artery disease       Physical Exam: Filed Vitals:   06/20/14 1804 06/20/14 1853 06/20/14 2203 06/21/14 0628  BP: 143/86 178/70 188/69 161/67  Pulse: 62 64 67 59  Temp: 98.5 F (36.9 C) 98 F (36.7 C) 98.4 F (36.9 C) 98.4 F (36.9 C)  TempSrc: Oral  Oral Oral  Resp: 18 18 18    Height:  5\' 3"  (1.6 m)    Weight:  81.647 kg (180 lb)  78.7 kg (173 lb 8 oz)  SpO2: 96% 98% 96% 95%   Blood pressure  161/67, pulse 59, temperature 98.4 F (36.9 C), temperature source Oral, resp. rate 18, height 5\' 3"  (1.6 m), weight 78.7 kg (173 lb 8 oz), SpO2 95 %.  GEN:  Pleasant patient lying in the stretcher in no acute distress; cooperative with exam. PSYCH:  alert and oriented x4; does not appear anxious or depressed; affect is appropriate. HEENT: Mucous membranes pink and anicteric; PERRLA; EOM intact; no cervical lymphadenopathy nor thyromegaly or carotid bruit; no JVD; There were no stridor. Neck is very supple. Breasts:: Not examined CHEST WALL: No tenderness CHEST: Normal respiration, clear to auscultation bilaterally.  HEART: Regular rate and rhythm.  There are no murmur, rub, or gallops.   BACK: No kyphosis or scoliosis; no CVA tenderness ABDOMEN: soft and non-tender; no masses, no organomegaly, normal abdominal bowel sounds; no pannus; no intertriginous candida. There is no rebound and no distention. Rectal Exam: Not done EXTREMITIES: No bone or joint deformity; age-appropriate arthropathy of the hands and knees; trace edema; no ulcerations.  There is no calf tenderness. Genitalia: not examined PULSES: 2+ and symmetric SKIN: Normal hydration no rash or ulceration CNS: Cranial nerves 2-12 grossly intact no focal lateralizing neurologic  deficit.  Speech is fluent; uvula elevated with phonation, facial symmetry and tongue midline. DTR are normal bilaterally, cerebella exam is intact, barbinski is negative and strengths are weak, left greater than right.   Labs on Admission:  Basic Metabolic Panel:  Recent Labs Lab 06/20/14 1540 06/21/14 0547  NA 138 140  K 3.5 3.3*  CL 104 107  CO2 24 25  GLUCOSE 139* 86  BUN 55* 50*  CREATININE 3.36* 3.13*  CALCIUM 9.0 8.6*   Liver Function Tests:  Recent Labs Lab 06/21/14 0547  AST 18  ALT 21  ALKPHOS 32*  BILITOT 1.4*  PROT 5.6*  ALBUMIN 2.3*   No results for input(s): LIPASE, AMYLASE in the last 168 hours. No results for input(s): AMMONIA in the last 168 hours. CBC:  Recent Labs Lab 06/20/14 1540 06/21/14 0547  WBC 5.9 5.1  NEUTROABS 3.7  --   HGB 10.7* 9.7*  HCT 32.9* 29.4*  MCV 97.1 97.0  PLT 225 210   Cardiac Enzymes:  Recent Labs Lab 06/20/14 1540  TROPONINI <0.03    CBG:  Recent Labs Lab 06/20/14 2201 06/21/14 0732  GLUCAP 101* 102*     Radiological Exams on Admission: Dg Chest 2 View  06/20/2014   CLINICAL DATA:  Edema in feet, ankles and legs for 4-5 days, 8 lb weight gain in past 6 days, coughing when lying flat, history type II diabetes, MI, essential benign hypertension, history melanoma  EXAM: CHEST  2 VIEW  COMPARISON:  12/14/2012  FINDINGS: Normal heart size, mediastinal contours, and pulmonary vascularity.  Question minimal infiltrate in both upper lobes and at LEFT base.  No pleural effusion or pneumothorax.  Bones unremarkable.  IMPRESSION: Suspect minimal BILATERAL upper lobe and questionable LEFT basilar infiltrates concerning for pneumonia.  In light of history of melanoma, radiographic followup recommended in 3-4 weeks recommended to ensure resolution.   Electronically Signed   By: Lavonia Dana M.D.   On: 06/20/2014 16:32   Ct Chest Wo Contrast  06/20/2014   CLINICAL DATA:  Chest congestion, cough.  History of melanoma.  EXAM:  CT CHEST WITHOUT CONTRAST  TECHNIQUE: Multidetector CT imaging of the chest was performed following the standard protocol without IV contrast.  COMPARISON:  Chest radiograph of same day.  FINDINGS: No pneumothorax is noted. Minimal bilateral pleural effusions  are noted with left greater than right. Multiple nodular and airspace opacities are noted throughout both lungs. Largest nodular density measuring 11 mm is noted in superior segment of right lower lobe. 8 mm nodule is noted medial and inferior to this. Ill-defined airspace opacity is noted anteriorly in the left upper lobe as well as in right lung apex. Ill-defined nodular opacity is seen anterior to left major fissure in left upper lobe, with several smaller nodular densities seen laterally in the left upper lobe. Large airspace opacity is noted laterally in right upper lobe most consistent with inflammation. Is uncertain if these findings represent multifocal pneumonia, or possibly metastatic disease given the history of melanoma. Coronary artery calcifications are noted suggesting coronary artery disease. Atherosclerosis of thoracic aorta is noted without aneurysm formation. No significant mediastinal mass or adenopathy is noted. Visualized portion of upper abdomen appears normal. No significant osseous abnormality is noted in the chest.  IMPRESSION: Minimal bilateral pleural effusions with left greater than right.  Multiple nodular and airspace opacities are noted throughout both lung which may represent multifocal pneumonia, but metastatic disease cannot be excluded given the history of melanoma. Follow-up CT scan in 2-3 weeks is recommended to ensure resolution of these abnormalities. If these abnormalities persist on followup CT scan, this would be suspicious for metastatic disease.   Electronically Signed   By: Marijo Conception, M.D.   On: 06/20/2014 18:05    EKG: Independently reviewed.   Assessment/Plan Present on Admission:  . Cough . Leg  edema . CKD (chronic kidney disease) stage 4, GFR 15-29 ml/min . Essential hypertension, benign  1. PLAN: Cough.  She could have pneumonia but she does not have a fever, white count or does not look toxic. This could represent fluid overload and congestive heart failure. We will treat with intravenous anti-biotics and IV Lasix. However also, this could represent metastatic disease from melanoma. I think she probably will require repeat chest x-ray or/and CT chest scan in 2-3 weeks. I updated her and her husband on the plan.  2. Leg weakness. The etiology is not clear to me. I will order MRI lumbar spine. I will ask neurology consultation. Physical therapy. 3. Hypertension. Stable. 4. Chronic kidney disease. Nephrology consultation.  Her Cr is around 3.2.  A year ago, it was in the 2's.  She has been on diuretic and ARB.  Will await further recommendation.  Lasix has been helpful with edema.  5. Diabetes. Continue with home medications and sliding scale of insulin.  Further recommendations will depend on patient's hospital progress.   Code Status: Full code.  DVT Prophylaxis: heparin.  Family Communication: I discussed the plan with the patient at the bedside.   Disposition Plan: home when medically stable.    Orvan Falconer, MD. [facility] Pager [phone] 7pm to 7am.  06/21/2014, 9:28 AM

## 2014-06-21 NOTE — Evaluation (Signed)
Physical Therapy Evaluation Patient Details Name: [name] MRN: [id number] DOB: 1945/11/30 Today's Date: 06/21/2014   History of Present Illness  This is a 69 year old lady who gives a one-week history of a dry cough. She also has noticed swelling of her legs bilaterally for the last couple of days. She denies any dyspnea, PND or orthopnea. She has no chest pain or palpitations. She has not been feverish. There is no nausea, vomiting or abdominal pain. This lady has a history of melanoma in the groin area that was excised several years ago. She was told it was an aggressive type and there was a likelihood of recurrence. Lymph nodes were taken at the time and apparently were clear of disease. Evaluation in the emergency room with CT scan of the chest is suggestive multiple areas of nodules possibly infective in origin with also bilateral pleural effusions. She also describes at least a one-year history where she has not walked. She is now wheelchair-bound. She has not had any investigation of her inability to walk and apparently has not had any physical therapy either. She is now being admitted for further management.  Clinical Impression   Pt was seen for evaluation.  Husband was present.  It would appear that pt had multiple falls about a year ago.  Since that time, she has been primarily w/c dependent.  It is not clear as to the etiology of her decreased balance.  She has since become weak and deconditioned.  Concurrent problems include peripheral LE neuropathy and a strong dependence on her husband.  Her weakness seems to be generalized and equal.  I do believe that she has the potential to develop significantly more strength and mobility than she has currently.  I am recommending SNF at d/c and pt/husband are in agreement.    Follow Up Recommendations SNF    Equipment Recommendations    none   Recommendations for Other Services   OT    Precautions / Restrictions  Precautions Precautions: Fall Restrictions Weight Bearing Restrictions: No      Mobility  Bed Mobility Overal bed mobility: Needs Assistance Bed Mobility: Supine to Sit     Supine to sit: Min assist     General bed mobility comments: pt normally sleeps in a recliner due to back pain (chronic)...therefore she has poor bed mobilty  Transfers Overall transfer level: Needs assistance Equipment used: Rolling walker (2 wheeled) Transfers: Sit to/from Stand Sit to Stand: Mod assist         General transfer comment: it took several attempts at standing with continual cues for technique for her to stand to walker  Ambulation/Gait Ambulation/Gait assistance: Min guard Ambulation Distance (Feet): 20 Feet Assistive device: Rolling walker (2 wheeled) Gait Pattern/deviations: Trunk flexed;Step-through pattern;Decreased dorsiflexion - right;Decreased dorsiflexion - left Gait velocity: appropriate for situation Gait velocity interpretation: Below normal speed for age/gender                    Balance Overall balance assessment: Needs assistance Sitting-balance support: No upper extremity supported;Feet supported Sitting balance-Leahy Scale: Fair Sitting balance - Comments: tends to fall backward if she has even the slightest challenge to sitting       Standing balance comment: not formally tested...husband relates that pt has had multiple falls in the past due to poor balance                             Pertinent Vitals/Pain  Pain Assessment: No/denies pain    Home Living Family/patient expects to be discharged to:: Skilled nursing facility Living Arrangements: Spouse/significant other                    Prior Function Level of Independence: Needs assistance   Gait / Transfers Assistance Needed: minimally ambulatory with a cane, assistance with all transfers...primarily w/c dependent  ADL's / Homemaking Assistance Needed: full assist with all  ADLs, able to self feed        Hand Dominance   Dominant Hand: Right    Extremity/Trunk Assessment               Lower Extremity Assessment: Generalized weakness (today her strength appeared to be equilateral)      Cervical / Trunk Assessment:  (significant weakness of core muscles)  Communication   Communication: No difficulties  Cognition Arousal/Alertness: Awake/alert Behavior During Therapy: WFL for tasks assessed/performed Overall Cognitive Status: Within Functional Limits for tasks assessed                                    Assessment/Plan    PT Assessment Patient needs continued PT services  PT Diagnosis Difficulty walking;Generalized weakness   PT Problem List Decreased strength;Decreased activity tolerance;Decreased balance;Decreased knowledge of use of DME;Decreased safety awareness;Cardiopulmonary status limiting activity  PT Treatment Interventions Gait training;Functional mobility training;Therapeutic exercise;Balance training   PT Goals (Current goals can be found in the Care Plan section) Acute Rehab PT Goals Patient Stated Goal: wants to get stronger and be able to walk more, not have to depend on husband so much PT Goal Formulation: With patient/family Time For Goal Achievement: 07/05/14 Potential to Achieve Goals: Good    Frequency Min 3X/week   Barriers to discharge   none                   End of Session Equipment Utilized During Treatment: Gait belt Activity Tolerance: Patient tolerated treatment well Patient left: in bed;with call bell/phone within reach Nurse Communication: Mobility status         Time: 1335-1415 PT Time Calculation (min) (ACUTE ONLY): 40 min   Charges:   PT Evaluation $Initial PT Evaluation Tier I: 1 Procedure     PT G CodesDemetrios Isaacs L  PT 06/21/2014, 2:27 PM [phone]

## 2014-06-21 NOTE — Progress Notes (Signed)
UR chart review completed.  

## 2014-06-21 NOTE — Consult Note (Addendum)
[place] A. Merlene Laughter, MD     [email or web address]          NAASIA WEILBACHER is an 69 y.o. female.   ASSESSMENT/PLAN: 1. Progressive gait impairment of unclear etiology but most likely multifactorial including osteoarthritis, dyspnea, or edema of the legs, neuropathy and possibly medication effect. The patient does seem to have proximal muscle weakness which is very suggestive of a myopathy. 2. Diabetes. 3. Motor articular osteoarthritis. 4. Remote history of the melanoma.  5. R facial weakness from remote Bell's palsy.   Recommendation: Discontinue all statins for at least 3 months and see how she does. Blood tests and for following: ANA, C-reactive protein, ESR, ANA, CPK, RPR and vitamin B12 level. Physical and occupational therapy. Likely will need skilled for rehabilitation. The patient is 69 year old white female who presents with progressive gait impairment and difficulty walking along with falling over the last 6 months. The patient reports having heaviness of the legs. Sometimes her legs just give out. She particularly has difficulties going up the stairs. She has been using something to ambulate either a cane or walker for the past 12 months. She became significantly impaired on the last 2 weeks. This was associated with swelling of the feet and legs however. She has been admitted because of her symptoms. She has been getting diuretics with improvement in the edema. She does not report having significant cramping or pain of the legs. There is no focal numbness, tingling or chest pain. No headaches are reported. No visual symptoms. Apparently New changes in medications have been made recently although she has been placed on antibiotics since being hospitalized. She does have a remote history of Bell's palsy about 10 years ago but no new focal neurological symptoms. Review of systems otherwise negative.  GENERAL: This is a very pleasant obese lady in no acute  distress.  HEENT: Supple. Atraumatic normocephalic. There is facial asymmetry with weakness of the obicularis oculi and the obicularis oris on the right side. This is moderately impaired.  ABDOMEN: soft  EXTREMITIES: No edema   BACK: Normal.  SKIN: Normal by inspection.    MENTAL STATUS: Alert and oriented. Speech, language and cognition are generally intact. Judgment and insight normal.   CRANIAL NERVES: Pupils are equal, round and reactive to light and accommodation; extra ocular movements are full, there is no significant nystagmus; visual fields are full; tongue is midline; uvula is midline; shoulder elevation is normal.  MOTOR: Upper and lower extremity shows proximal muscle weakness 4+/5 bilaterally. Distal strength is normal. Bulk and tone are normal.  COORDINATION: Left finger to nose is normal, right finger to nose is normal, No rest tremor; no intention tremor; no postural tremor; no bradykinesia.  REFLEXES: Deep tendon reflexes are symmetrical and normal. Babinski reflexes are flexor bilaterally.   SENSATION: Normal to light touch.       HOSPITALIST NOTES: HPI: DAKSHA KOONE is a 69 y.o. female  This is a 69 year old lady who gives a one-week history of a dry cough. She also has noticed swelling of her legs bilaterally for the last couple of days. She denies any dyspnea, PND or orthopnea. She has no chest pain or palpitations. She has not been feverish. There is no nausea, vomiting or abdominal pain. This lady has a history of melanoma in the groin area that was excised several years ago. She was told it was an aggressive type and there was a likelihood of recurrence. Lymph nodes were taken at the time and  apparently were clear of disease. Evaluation in the emergency room with CT scan of the chest is suggestive multiple areas of nodules possibly infective in origin with also bilateral pleural effusions. She also describes at least a one-year history where she has not  walked. She is now wheelchair-bound. She has not had any investigation of her inability to walk and apparently has not had any physical therapy either. She is now being admitted for further management.  Blood pressure 155/71, pulse 66, temperature 98.7 F (37.1 C), temperature source Oral, resp. rate 20, height $RemoveBe'5\' 3"'gJvvpYidn$  (1.6 m), weight 78.7 kg (173 lb 8 oz), SpO2 95 %.  Past Medical History  Diagnosis Date  . Fibromyalgia   . Hypothyroidism   . Diabetes mellitus, type 2   . Coronary atherosclerosis of native coronary artery     BMS circ/RCA 2001, BMS RCA 2003, PTCA RCA/ramus 2004, DES RCA 2005, DES RCA 2007, DES OM/LAD 2009, DES RCA 7/11, LVEF 65%  . Hyperlipidemia   . Essential hypertension, benign   . MI (myocardial infarction)   . Melanoma     Past Surgical History  Procedure Laterality Date  . Cholecystectomy    . Total abdominal hysterectomy    . Bladder suspension    . Neuroplasty / transposition median nerve at carpal tunnel bilateral    . Coronary stent placement    . Tumor removal      Family History  Problem Relation Age of Onset  . Coronary artery disease      Social History:  reports that she has never smoked. She has never used smokeless tobacco. She reports that she does not drink alcohol or use illicit drugs.  Allergies:  Allergies  Allergen Reactions  . Sulfonamide Derivatives Swelling    REACTION: throat swelling    Medications: Prior to Admission medications   Medication Sig Start Date End Date Taking? Authorizing Provider  amitriptyline (ELAVIL) 10 MG tablet Take 30 mg by mouth at bedtime. Takes in addition to the $Remo'100mg'lDipk$  at bedtime.   Yes Historical Provider, MD  amitriptyline (ELAVIL) 100 MG tablet Take 100 mg by mouth at bedtime.     Yes Historical Provider, MD  amLODipine (NORVASC) 10 MG tablet Take 10 mg by mouth daily.     Yes Historical Provider, MD  aspirin 81 MG tablet Take 81 mg by mouth at bedtime.    Yes Historical Provider, MD  carvedilol  (COREG) 25 MG tablet Take 25 mg by mouth 2 (two) times daily.     Yes Historical Provider, MD  Cholecalciferol (VITAMIN D) 2000 UNITS CAPS Take 1 capsule by mouth every morning.    Yes Historical Provider, MD  cloNIDine (CATAPRES) 0.3 MG tablet Take 0.3 mg by mouth 2 (two) times daily.     Yes Historical Provider, MD  clopidogrel (PLAVIX) 75 MG tablet Take 75 mg by mouth every morning.    Yes Historical Provider, MD  furosemide (LASIX) 80 MG tablet Take 80 mg by mouth 2 (two) times daily.    Yes Historical Provider, MD  glyBURIDE micronized (GLYNASE) 6 MG tablet Take 6 mg by mouth daily.    Yes Historical Provider, MD  hydrALAZINE (APRESOLINE) 25 MG tablet Take 25 mg by mouth 3 (three) times daily.   Yes Satira Sark, MD  insulin glargine (LANTUS) 100 UNIT/ML injection Inject 20-30 Units into the skin at bedtime. *Based on blood sugar levels   Yes Historical Provider, MD  insulin glulisine (APIDRA) 100 UNIT/ML injection Inject 5 Units  into the skin 2 (two) times daily after a meal. After breakfast & lunch.   Yes Historical Provider, MD  isosorbide mononitrate (IMDUR) 60 MG 24 hr tablet TAKE $RemoveBef'60MG'AqCCLJFBLd$  IN THE AM AND $Remo'30MG'VtDWm$  IN THE PM (TAKE HALF TABLET IF SCORED) Patient taking differently: Take 30-60 mg by mouth 2 (two) times daily. TAKE $RemoveBef'60MG'YcKFyTzeHs$  IN THE AM AND $Remo'30MG'NgzvA$  IN THE PM (TAKE HALF TABLET IF SCORED) 12/22/12  Yes Satira Sark, MD  levothyroxine (SYNTHROID, LEVOTHROID) 150 MCG tablet Take 150 mcg by mouth daily before breakfast.   Yes Historical Provider, MD  nabumetone (RELAFEN) 750 MG tablet Take 750 mg by mouth 2 (two) times daily.     Yes Historical Provider, MD  nitroGLYCERIN (NITROSTAT) 0.4 MG SL tablet Place 0.4 mg under the tongue every 5 (five) minutes as needed.     Yes Historical Provider, MD  Omega-3 Fatty Acids (FISH OIL) 1000 MG CAPS Take 2-3 capsules by mouth 2 (two) times daily. Take 3 in am and 2 at bedtime   Yes Historical Provider, MD  polyethylene glycol powder (MIRALAX) powder Take  17 g by mouth daily as needed for mild constipation. In 8 oz of water or juice   Yes Historical Provider, MD  simvastatin (ZOCOR) 40 MG tablet Take 40 mg by mouth at bedtime.    Yes Historical Provider, MD  valsartan (DIOVAN) 320 MG tablet Take 320 mg by mouth every morning.    Yes Historical Provider, MD  vitamin C (ASCORBIC ACID) 500 MG tablet Take 500 mg by mouth daily.     Yes Historical Provider, MD  vitamin E 400 UNIT capsule Take 400 Units by mouth daily.     Yes Historical Provider, MD    Scheduled Meds: . amitriptyline  130 mg Oral QHS  . amLODipine  10 mg Oral Daily  . aspirin  81 mg Oral QHS  . azithromycin  500 mg Intravenous Q24H  . carvedilol  25 mg Oral BID WC  . cefTRIAXone (ROCEPHIN)  IV  1 g Intravenous Q24H  . cholecalciferol  2,000 Units Oral q morning - 10a  . cloNIDine  0.3 mg Oral BID  . clopidogrel  75 mg Oral q morning - 10a  . furosemide  80 mg Intravenous BID  . heparin  5,000 Units Subcutaneous 3 times per day  . hydrALAZINE  25 mg Oral TID  . insulin aspart  0-5 Units Subcutaneous QHS  . insulin aspart  0-9 Units Subcutaneous TID WC  . insulin glargine  20 Units Subcutaneous QHS  . irbesartan  300 mg Oral Daily  . isosorbide mononitrate  60 mg Oral Daily   And  . isosorbide mononitrate  30 mg Oral QHS  . levothyroxine  150 mcg Oral QAC breakfast  . omega-3 acid ethyl esters  1 g Oral Daily  . simvastatin  40 mg Oral QHS  . sodium chloride  3 mL Intravenous Q12H  . sodium chloride  3 mL Intravenous Q12H  . vitamin C  500 mg Oral Daily   Continuous Infusions:  PRN Meds:.nitroGLYCERIN, ondansetron **OR** ondansetron (ZOFRAN) IV, polyethylene glycol     Results for orders placed or performed during the hospital encounter of 06/20/14 (from the past 48 hour(s))  CBC with Differential     Status: Abnormal   Collection Time: 06/20/14  3:40 PM  Result Value Ref Range   WBC 5.9 4.0 - 10.5 K/uL   RBC 3.39 (L) 3.87 - 5.11 MIL/uL   Hemoglobin 10.7 (L)  12.0 - 15.0 g/dL   HCT 27.3 (L) 52.2 - 62.3 %   MCV 97.1 78.0 - 100.0 fL   MCH 31.6 26.0 - 34.0 pg   MCHC 32.5 30.0 - 36.0 g/dL   RDW 49.6 45.7 - 87.7 %   Platelets 225 150 - 400 K/uL   Neutrophils Relative % 62 43 - 77 %   Neutro Abs 3.7 1.7 - 7.7 K/uL   Lymphocytes Relative 21 12 - 46 %   Lymphs Abs 1.3 0.7 - 4.0 K/uL   Monocytes Relative 12 3 - 12 %   Monocytes Absolute 0.7 0.1 - 1.0 K/uL   Eosinophils Relative 4 0 - 5 %   Eosinophils Absolute 0.2 0.0 - 0.7 K/uL   Basophils Relative 1 0 - 1 %   Basophils Absolute 0.0 0.0 - 0.1 K/uL  Basic metabolic panel     Status: Abnormal   Collection Time: 06/20/14  3:40 PM  Result Value Ref Range   Sodium 138 135 - 145 mmol/L   Potassium 3.5 3.5 - 5.1 mmol/L   Chloride 104 101 - 111 mmol/L   CO2 24 22 - 32 mmol/L   Glucose, Bld 139 (H) 65 - 99 mg/dL   BUN 55 (H) 6 - 20 mg/dL   Creatinine, Ser 7.04 (H) 0.44 - 1.00 mg/dL   Calcium 9.0 8.9 - 29.7 mg/dL   GFR calc non Af Amer 13 (L) >60 mL/min   GFR calc Af Amer 15 (L) >60 mL/min    Comment: (NOTE) The eGFR has been calculated using the CKD EPI equation. This calculation has not been validated in all clinical situations. eGFR's persistently <60 mL/min signify possible Chronic Kidney Disease.    Anion gap 10 5 - 15  Troponin I     Status: None   Collection Time: 06/20/14  3:40 PM  Result Value Ref Range   Troponin I <0.03 <0.031 ng/mL    Comment:        NO INDICATION OF MYOCARDIAL INJURY.   Brain natriuretic peptide     Status: Abnormal   Collection Time: 06/20/14  3:40 PM  Result Value Ref Range   B Natriuretic Peptide 267.0 (H) 0.0 - 100.0 pg/mL  TSH     Status: None   Collection Time: 06/20/14  3:40 PM  Result Value Ref Range   TSH 1.461 0.350 - 4.500 uIU/mL  Glucose, capillary     Status: Abnormal   Collection Time: 06/20/14 10:01 PM  Result Value Ref Range   Glucose-Capillary 101 (H) 65 - 99 mg/dL  Comprehensive metabolic panel     Status: Abnormal   Collection Time:  06/21/14  5:47 AM  Result Value Ref Range   Sodium 140 135 - 145 mmol/L   Potassium 3.3 (L) 3.5 - 5.1 mmol/L   Chloride 107 101 - 111 mmol/L   CO2 25 22 - 32 mmol/L   Glucose, Bld 86 65 - 99 mg/dL   BUN 50 (H) 6 - 20 mg/dL   Creatinine, Ser 6.76 (H) 0.44 - 1.00 mg/dL   Calcium 8.6 (L) 8.9 - 10.3 mg/dL   Total Protein 5.6 (L) 6.5 - 8.1 g/dL   Albumin 2.3 (L) 3.5 - 5.0 g/dL   AST 18 15 - 41 U/L   ALT 21 14 - 54 U/L   Alkaline Phosphatase 32 (L) 38 - 126 U/L   Total Bilirubin 1.4 (H) 0.3 - 1.2 mg/dL   GFR calc non Af Amer 14 (L) >60 mL/min  GFR calc Af Amer 16 (L) >60 mL/min    Comment: (NOTE) The eGFR has been calculated using the CKD EPI equation. This calculation has not been validated in all clinical situations. eGFR's persistently <60 mL/min signify possible Chronic Kidney Disease.    Anion gap 8 5 - 15  CBC     Status: Abnormal   Collection Time: 06/21/14  5:47 AM  Result Value Ref Range   WBC 5.1 4.0 - 10.5 K/uL   RBC 3.03 (L) 3.87 - 5.11 MIL/uL   Hemoglobin 9.7 (L) 12.0 - 15.0 g/dL   HCT 29.4 (L) 36.0 - 46.0 %   MCV 97.0 78.0 - 100.0 fL   MCH 32.0 26.0 - 34.0 pg   MCHC 33.0 30.0 - 36.0 g/dL   RDW 13.2 11.5 - 15.5 %   Platelets 210 150 - 400 K/uL  Glucose, capillary     Status: Abnormal   Collection Time: 06/21/14  7:32 AM  Result Value Ref Range   Glucose-Capillary 102 (H) 65 - 99 mg/dL   Comment 1 Notify RN    Comment 2 Document in Chart   Glucose, capillary     Status: Abnormal   Collection Time: 06/21/14 11:53 AM  Result Value Ref Range   Glucose-Capillary 130 (H) 65 - 99 mg/dL   Comment 1 Notify RN    Comment 2 Document in Chart   Glucose, capillary     Status: Abnormal   Collection Time: 06/21/14  4:29 PM  Result Value Ref Range   Glucose-Capillary 169 (H) 65 - 99 mg/dL   Comment 1 Notify RN    Comment 2 Document in Chart     Studies/Results:  Lumbar spine MRI: 1. Compared with the prior study from 2 years ago, there progressive diffuse marrow  changes attributed to renal failure/anemia. No acute osseous findings. 2. No acute findings demonstrated within the lumbar spine. 3. Mildly progressive annular disc bulging, facet and ligamentous hypertrophy at L4-5 contributing to moderate multifactorial spinal stenosis and mild narrowing of the lateral recesses. 4. Mildly progressive chronic disc and endplate degeneration at L5-S1 with stable small disc fragment in the right subarticular zone.   Leonore Frankson A. Merlene Laughter, M.D.  Diplomate, Tax adviser of Psychiatry and Neurology ( Neurology). 06/21/2014, 8:20 PM

## 2014-06-22 DIAGNOSIS — J189 Pneumonia, unspecified organism: Principal | ICD-10-CM

## 2014-06-22 DIAGNOSIS — I10 Essential (primary) hypertension: Secondary | ICD-10-CM

## 2014-06-22 DIAGNOSIS — N184 Chronic kidney disease, stage 4 (severe): Secondary | ICD-10-CM

## 2014-06-22 DIAGNOSIS — R600 Localized edema: Secondary | ICD-10-CM

## 2014-06-22 LAB — GLUCOSE, CAPILLARY
GLUCOSE-CAPILLARY: 211 mg/dL — AB (ref 65–99)
Glucose-Capillary: 95 mg/dL (ref 65–99)
Glucose-Capillary: 125 mg/dL — ABNORMAL HIGH (ref 65–99)
Glucose-Capillary: 118 mg/dL — ABNORMAL HIGH (ref 65–99)

## 2014-06-22 LAB — BASIC METABOLIC PANEL
Anion gap: 10 (ref 5–15)
BUN: 51 mg/dL — AB (ref 6–20)
CO2: 25 mmol/L (ref 22–32)
Calcium: 8.7 mg/dL — ABNORMAL LOW (ref 8.9–10.3)
Chloride: 104 mmol/L (ref 101–111)
Creatinine, Ser: 3.11 mg/dL — ABNORMAL HIGH (ref 0.44–1.00)
GFR calc non Af Amer: 14 mL/min — ABNORMAL LOW (ref >60)
GFR, EST AFRICAN AMERICAN: 17 mL/min — AB (ref >60)
Glucose, Bld: 145 mg/dL — ABNORMAL HIGH (ref 65–99)
POTASSIUM: 3.5 mmol/L (ref 3.5–5.1)
Sodium: 139 mmol/L (ref 135–145)

## 2014-06-22 LAB — C-REACTIVE PROTEIN: CRP: 0.7 mg/dL (ref <1.0)

## 2014-06-22 LAB — SEDIMENTATION RATE: Sed Rate: 35 mm/hr — ABNORMAL HIGH (ref 0–22)

## 2014-06-22 LAB — VITAMIN B12: Vitamin B-12: 701 pg/mL (ref 180–914)

## 2014-06-22 NOTE — Progress Notes (Signed)
[facility] PROGRESS NOTE  [name] EXB:[id number] DOB: 08/07/1945 DOA: 06/20/2014 PCP: Octavio Graves, DO  Assessment/Plan: 1. Community-acquired pneumonia. Patient is on Rocephin and azithromycin. Clinically she appears to be stable. She will need repeat CT scan in 2-3 weeks to ensure resolution of the lung findings, to ensure that there is no underlying malignancy. 2. Bilateral lower extremity weakness. MRI of the lumbar spine is unremarkable for acute findings. Neurology has seen the patient feels that her weakness is likely multifactorial related to degenerative disease, deconditioning, neuropathy, medications and progressive renal failure. Recommendation for skilled nursing facility. 3. Chronic kidney disease, likely stage IV. Creatinine is currently 3.1 and appears stable. Patient is followed by outpatient nephrology. 4. Lower extremity edema. Appears to be a chronic finding. Patient has been taking 80 mg of Lasix twice a day and has still had worsening of her edema. We'll likely need transition to oral Demadex on discharge. Continue IV Lasix for today. Possible to transition to [facility] tomorrow. 5. Diabetes. Continue home medications on his own insulin.  Code Status: full code Family Communication: discussed with husband at the bedside Disposition Plan: discharge to SNF when improved,   Consultants:  Neurology   HPI/Subjective: Still feels weak, lower extremity edema is improving. Has occasional shortness of breath, no chest pain  Objective: Filed Vitals:   06/22/14 1343  BP: 160/62  Pulse: 58  Temp: 97.9 F (36.6 C)  Resp: 18    Intake/Output Summary (Last 24 hours) at 06/22/14 1440 Last data filed at 06/22/14 1400  Gross per 24 hour  Intake    890 ml  Output   1400 ml  Net   -510 ml   Filed Weights   06/20/14 1853 06/21/14 0628 06/22/14 0431  Weight: 81.647 kg (180 lb) 78.7 kg (173 lb 8 oz) 78.109 kg (172 lb 3.2 oz)    Exam:   General:   NAD  Cardiovascular: S1, S2 RRR  Respiratory: crackles at bases  Abdomen: soft, nt,nd, bs+  Musculoskeletal: 2+ edema b/l   Data Reviewed: Basic Metabolic Panel:  Recent Labs Lab 06/20/14 1540 06/21/14 0547 06/22/14 1220  NA 138 140 139  K 3.5 3.3* 3.5  CL 104 107 104  CO2 24 25 25   GLUCOSE 139* 86 145*  BUN 55* 50* 51*  CREATININE 3.36* 3.13* 3.11*  CALCIUM 9.0 8.6* 8.7*   Liver Function Tests:  Recent Labs Lab 06/21/14 0547  AST 18  ALT 21  ALKPHOS 32*  BILITOT 1.4*  PROT 5.6*  ALBUMIN 2.3*   No results for input(s): LIPASE, AMYLASE in the last 168 hours. No results for input(s): AMMONIA in the last 168 hours. CBC:  Recent Labs Lab 06/20/14 1540 06/21/14 0547  WBC 5.9 5.1  NEUTROABS 3.7  --   HGB 10.7* 9.7*  HCT 32.9* 29.4*  MCV 97.1 97.0  PLT 225 210   Cardiac Enzymes:  Recent Labs Lab 06/20/14 1540  TROPONINI <0.03   BNP (last 3 results)  Recent Labs  06/20/14 1540  BNP 267.0*    ProBNP (last 3 results) No results for input(s): PROBNP in the last 8760 hours.  CBG:  Recent Labs Lab 06/21/14 1153 06/21/14 1629 06/21/14 2106 06/22/14 0743 06/22/14 1108  GLUCAP 130* 169* 232* 95 125*    No results found for this or any previous visit (from the past 240 hour(s)).   Studies: Dg Chest 2 View  06/20/2014   CLINICAL DATA:  Edema in feet, ankles and legs for 4-5 days, 8  lb weight gain in past 6 days, coughing when lying flat, history type II diabetes, MI, essential benign hypertension, history melanoma  EXAM: CHEST  2 VIEW  COMPARISON:  12/14/2012  FINDINGS: Normal heart size, mediastinal contours, and pulmonary vascularity.  Question minimal infiltrate in both upper lobes and at LEFT base.  No pleural effusion or pneumothorax.  Bones unremarkable.  IMPRESSION: Suspect minimal BILATERAL upper lobe and questionable LEFT basilar infiltrates concerning for pneumonia.  In light of history of melanoma, radiographic followup recommended in  3-4 weeks recommended to ensure resolution.   Electronically Signed   By: Lavonia Dana M.D.   On: 06/20/2014 16:32   Ct Chest Wo Contrast  06/20/2014   CLINICAL DATA:  Chest congestion, cough.  History of melanoma.  EXAM: CT CHEST WITHOUT CONTRAST  TECHNIQUE: Multidetector CT imaging of the chest was performed following the standard protocol without IV contrast.  COMPARISON:  Chest radiograph of same day.  FINDINGS: No pneumothorax is noted. Minimal bilateral pleural effusions are noted with left greater than right. Multiple nodular and airspace opacities are noted throughout both lungs. Largest nodular density measuring 11 mm is noted in superior segment of right lower lobe. 8 mm nodule is noted medial and inferior to this. Ill-defined airspace opacity is noted anteriorly in the left upper lobe as well as in right lung apex. Ill-defined nodular opacity is seen anterior to left major fissure in left upper lobe, with several smaller nodular densities seen laterally in the left upper lobe. Large airspace opacity is noted laterally in right upper lobe most consistent with inflammation. Is uncertain if these findings represent multifocal pneumonia, or possibly metastatic disease given the history of melanoma. Coronary artery calcifications are noted suggesting coronary artery disease. Atherosclerosis of thoracic aorta is noted without aneurysm formation. No significant mediastinal mass or adenopathy is noted. Visualized portion of upper abdomen appears normal. No significant osseous abnormality is noted in the chest.  IMPRESSION: Minimal bilateral pleural effusions with left greater than right.  Multiple nodular and airspace opacities are noted throughout both lung which may represent multifocal pneumonia, but metastatic disease cannot be excluded given the history of melanoma. Follow-up CT scan in 2-3 weeks is recommended to ensure resolution of these abnormalities. If these abnormalities persist on followup CT scan,  this would be suspicious for metastatic disease.   Electronically Signed   By: Marijo Conception, M.D.   On: 06/20/2014 18:05   Mr Lumbar Spine Wo Contrast  06/21/2014   CLINICAL DATA:  Leg weakness with decreased ambulation for 1 year. No acute injury or prior relevant surgery. History of chronic kidney disease and diabetes. Initial encounter.  EXAM: MRI LUMBAR SPINE WITHOUT CONTRAST  TECHNIQUE: Multiplanar, multisequence MR imaging of the lumbar spine was performed. No intravenous contrast was administered.  COMPARISON:  Lumbar MRI 07/08/2012.  FINDINGS: Five lumbar type vertebral bodies are assumed. The alignment is normal. There is no evidence of acute fracture or pars defect. There is progressive diffuse marrow heterogeneity attributed to renal osteodystrophy or anemia. No focally suspicious lesions identified on inversion recovery images. Chronic discal hyperintensity again noted at L5-S1. Adjacent type 2 endplate changes are stable. There is no evidence of endplate destruction.  The conus medullaris extends to the L1-2 level and appears normal. No focal paraspinal abnormalities identified. There is generalized mesenteric, subcutaneous and retroperitoneal soft tissue edema.  No significant disc space findings from T11-12 through L1-2.  L2-3: With minimally progressive annular disc bulging eccentric to the left. The resulting  borderline spinal stenosis is unchanged. There is no foraminal compromise or definite nerve root encroachment.  L3-4: Stable mild disc bulging, facet and ligamentous hypertrophy. No spinal stenosis or nerve root encroachment.  L4-5: Mildly progressive annular disc bulging, facet and ligamentous hypertrophy. The resulting central stenosis is slightly progressive, although still moderate. There is mild narrowing of the lateral recesses, left greater than right. The foramina are patent.  L5-S1: Slowly progressive chronic disc and endplate degeneration. Chronic disc fragment in the right  subarticular zone is unchanged. This may contribute to chronic right S1 nerve root encroachment. There is stable mild foraminal narrowing bilaterally due to disc bulging and osteophytes.  IMPRESSION: 1. Compared with the prior study from 2 years ago, there progressive diffuse marrow changes attributed to renal failure/anemia. No acute osseous findings. 2. No acute findings demonstrated within the lumbar spine. 3. Mildly progressive annular disc bulging, facet and ligamentous hypertrophy at L4-5 contributing to moderate multifactorial spinal stenosis and mild narrowing of the lateral recesses. 4. Mildly progressive chronic disc and endplate degeneration at L5-S1 with stable small disc fragment in the right subarticular zone.   Electronically Signed   By: Richardean Sale M.D.   On: 06/21/2014 11:19    Scheduled Meds: . amitriptyline  130 mg Oral QHS  . amLODipine  10 mg Oral Daily  . aspirin  81 mg Oral QHS  . azithromycin  500 mg Intravenous Q24H  . carvedilol  25 mg Oral BID WC  . cefTRIAXone (ROCEPHIN)  IV  1 g Intravenous Q24H  . cholecalciferol  2,000 Units Oral q morning - 10a  . cloNIDine  0.3 mg Oral BID  . clopidogrel  75 mg Oral q morning - 10a  . furosemide  80 mg Intravenous BID  . heparin  5,000 Units Subcutaneous 3 times per day  . hydrALAZINE  25 mg Oral TID  . insulin aspart  0-5 Units Subcutaneous QHS  . insulin aspart  0-9 Units Subcutaneous TID WC  . insulin glargine  20 Units Subcutaneous QHS  . irbesartan  300 mg Oral Daily  . isosorbide mononitrate  60 mg Oral Daily   And  . isosorbide mononitrate  30 mg Oral QHS  . levothyroxine  150 mcg Oral QAC breakfast  . omega-3 acid ethyl esters  1 g Oral Daily  . sodium chloride  3 mL Intravenous Q12H  . sodium chloride  3 mL Intravenous Q12H  . vitamin C  500 mg Oral Daily   Continuous Infusions:   Active Problems:   Essential hypertension, benign   CKD (chronic kidney disease) stage 4, GFR 15-29 ml/min   Cough    Weakness   Leg edema   Diabetes   History of melanoma    Time spent: 66mins    Bayron Dalto  [facility] Pager [phone]. If 7PM-7AM, please contact night-coverage at [email or web address], password [facility] 06/22/2014, 2:40 PM  LOS: 2 days

## 2014-06-22 NOTE — Clinical Social Work Placement (Signed)
   CLINICAL SOCIAL WORK PLACEMENT  NOTE  Date:  06/22/2014  Patient Details  Name: YOMARA TOOTHMAN MRN: [id number] Date of Birth: [birth year]  Clinical Social Work is seeking post-discharge placement for this patient at the [place] level of care (*CSW will initial, date and re-position this form in  chart as items are completed):  Yes   Patient/family provided with [facility] Work Department's list of facilities offering this level of care within the geographic area requested by the patient (or if unable, by the patient's family).  Yes   Patient/family informed of their freedom to choose among providers that offer the needed level of care, that participate in Medicare, Medicaid or managed care program needed by the patient, have an available bed and are willing to accept the patient.  Yes   Patient/family informed of [facility]'s ownership interest in [facility] and [facility], as well as of the fact that they are under no obligation to receive care at these facilities.  PASRR submitted to EDS on       PASRR number received on       Existing PASRR number confirmed on       FL2 transmitted to all facilities in geographic area requested by pt/family on 06/21/14     FL2 transmitted to all facilities within larger geographic area on       Patient informed that his/her managed care company has contracts with or will negotiate with certain facilities, including the following:        Yes   Patient/family informed of bed offers received.  Patient chooses bed at [facility] and [facility]     Physician recommends and patient chooses bed at      Patient to be transferred to   on  .  Patient to be transferred to facility by       Patient family notified on   of transfer.  Name of family member notified:        PHYSICIAN       Additional Comment:    _______________________________________________ Salome Arnt,  LCSW 06/22/2014, 8:16 AM [phone]

## 2014-06-22 NOTE — Progress Notes (Signed)
Patient ID: [name], female   DOB: Mar 12, 1945, 69 y.o.   MRN: [id number]   [place] A. Merlene Laughter, MD     [email or web address]          [name] is an 69 y.o. female.   ASSESSMENT/PLAN: 1. Progressive gait impairment of unclear etiology but most likely multifactorial including osteoarthritis, dyspnea, or edema of the legs, neuropathy and possibly medication effect. The patient does seem to have proximal muscle weakness which is very suggestive of a myopathy. 2. Diabetes. 3. Multi-articular osteoarthritis. 4. Remote history of the melanoma.  5. R facial weakness from remote Bell's palsy.   Recommendation: Discontinue all statins for at least 3 months and see how she does. Blood tests and for following: ANA, C-reactive protein, ESR, ANA, CPK, RPR and vitamin B12 level. Physical and occupational therapy. Likely will need skilled for rehabilitation.  She reports some improvement. She reports using walker today in room.    GENERAL: This is a very pleasant obese lady in no acute distress.  HEENT: Supple. Atraumatic normocephalic. There is facial asymmetry with weakness of the obicularis oculi and the obicularis oris on the right side. This is moderately impaired.  ABDOMEN: soft  EXTREMITIES: No edema   BACK: Normal.  SKIN: Normal by inspection.   MENTAL STATUS: Alert and oriented. Speech, language and cognition are generally intact. Judgment and insight normal.   CRANIAL NERVES: Pupils are equal, round and reactive to light and accommodation; extra ocular movements are full, there is no significant nystagmus; visual fields are full; tongue is midline; uvula is midline; shoulder elevation is normal.  MOTOR: Upper and lower extremity shows proximal muscle weakness 4+/5 bilaterally. Distal strength is normal. Bulk and tone are normal.  COORDINATION: Left finger to nose is normal, right finger to nose is normal, No rest tremor; no intention  tremor; no postural tremor; no bradykinesia.  REFLEXES: Deep tendon reflexes are symmetrical and normal. Babinski reflexes are flexor bilaterally.   SENSATION: Normal to light touch.   Blood pressure 160/62, pulse 58, temperature 97.9 F (36.6 C), temperature source Oral, resp. rate 18, height $RemoveBe'5\' 3"'acBDkFnYf$  (1.6 m), weight 78.109 kg (172 lb 3.2 oz), SpO2 94 %.  Past Medical History  Diagnosis Date  . Fibromyalgia   . Hypothyroidism   . Diabetes mellitus, type 2   . Coronary atherosclerosis of native coronary artery     BMS circ/RCA 2001, BMS RCA 2003, PTCA RCA/ramus 2004, DES RCA 2005, DES RCA 2007, DES OM/LAD 2009, DES RCA 7/11, LVEF 65%  . Hyperlipidemia   . Essential hypertension, benign   . MI (myocardial infarction)   . Melanoma     Past Surgical History  Procedure Laterality Date  . Cholecystectomy    . Total abdominal hysterectomy    . Bladder suspension    . Neuroplasty / transposition median nerve at carpal tunnel bilateral    . Coronary stent placement    . Tumor removal      Family History  Problem Relation Age of Onset  . Coronary artery disease      Social History:  reports that she has never smoked. She has never used smokeless tobacco. She reports that she does not drink alcohol or use illicit drugs.  Allergies:  Allergies  Allergen Reactions  . Sulfonamide Derivatives Swelling    REACTION: throat swelling    Medications: Prior to Admission medications   Medication Sig Start Date End Date Taking? Authorizing Provider  amitriptyline (ELAVIL) 10 MG tablet  Take 30 mg by mouth at bedtime. Takes in addition to the $Remo'100mg'cadIr$  at bedtime.   Yes Historical Provider, MD  amitriptyline (ELAVIL) 100 MG tablet Take 100 mg by mouth at bedtime.     Yes Historical Provider, MD  amLODipine (NORVASC) 10 MG tablet Take 10 mg by mouth daily.     Yes Historical Provider, MD  aspirin 81 MG tablet Take 81 mg by mouth at bedtime.    Yes Historical Provider, MD  carvedilol (COREG) 25  MG tablet Take 25 mg by mouth 2 (two) times daily.     Yes Historical Provider, MD  Cholecalciferol (VITAMIN D) 2000 UNITS CAPS Take 1 capsule by mouth every morning.    Yes Historical Provider, MD  cloNIDine (CATAPRES) 0.3 MG tablet Take 0.3 mg by mouth 2 (two) times daily.     Yes Historical Provider, MD  clopidogrel (PLAVIX) 75 MG tablet Take 75 mg by mouth every morning.    Yes Historical Provider, MD  furosemide (LASIX) 80 MG tablet Take 80 mg by mouth 2 (two) times daily.    Yes Historical Provider, MD  glyBURIDE micronized (GLYNASE) 6 MG tablet Take 6 mg by mouth daily.    Yes Historical Provider, MD  hydrALAZINE (APRESOLINE) 25 MG tablet Take 25 mg by mouth 3 (three) times daily.   Yes Satira Sark, MD  insulin glargine (LANTUS) 100 UNIT/ML injection Inject 20-30 Units into the skin at bedtime. *Based on blood sugar levels   Yes Historical Provider, MD  insulin glulisine (APIDRA) 100 UNIT/ML injection Inject 5 Units into the skin 2 (two) times daily after a meal. After breakfast & lunch.   Yes Historical Provider, MD  isosorbide mononitrate (IMDUR) 60 MG 24 hr tablet TAKE $RemoveBef'60MG'IjCxJPxzyR$  IN THE AM AND $Remo'30MG'mxdGL$  IN THE PM (TAKE HALF TABLET IF SCORED) Patient taking differently: Take 30-60 mg by mouth 2 (two) times daily. TAKE $RemoveBef'60MG'mLwjyJEkOy$  IN THE AM AND $Remo'30MG'brlXg$  IN THE PM (TAKE HALF TABLET IF SCORED) 12/22/12  Yes Satira Sark, MD  levothyroxine (SYNTHROID, LEVOTHROID) 150 MCG tablet Take 150 mcg by mouth daily before breakfast.   Yes Historical Provider, MD  nabumetone (RELAFEN) 750 MG tablet Take 750 mg by mouth 2 (two) times daily.     Yes Historical Provider, MD  nitroGLYCERIN (NITROSTAT) 0.4 MG SL tablet Place 0.4 mg under the tongue every 5 (five) minutes as needed.     Yes Historical Provider, MD  Omega-3 Fatty Acids (FISH OIL) 1000 MG CAPS Take 2-3 capsules by mouth 2 (two) times daily. Take 3 in am and 2 at bedtime   Yes Historical Provider, MD  polyethylene glycol powder (MIRALAX) powder Take 17 g by  mouth daily as needed for mild constipation. In 8 oz of water or juice   Yes Historical Provider, MD  simvastatin (ZOCOR) 40 MG tablet Take 40 mg by mouth at bedtime.    Yes Historical Provider, MD  valsartan (DIOVAN) 320 MG tablet Take 320 mg by mouth every morning.    Yes Historical Provider, MD  vitamin C (ASCORBIC ACID) 500 MG tablet Take 500 mg by mouth daily.     Yes Historical Provider, MD  vitamin E 400 UNIT capsule Take 400 Units by mouth daily.     Yes Historical Provider, MD    Scheduled Meds: . amitriptyline  130 mg Oral QHS  . amLODipine  10 mg Oral Daily  . aspirin  81 mg Oral QHS  . azithromycin  500 mg Intravenous Q24H  .  carvedilol  25 mg Oral BID WC  . cefTRIAXone (ROCEPHIN)  IV  1 g Intravenous Q24H  . cholecalciferol  2,000 Units Oral q morning - 10a  . cloNIDine  0.3 mg Oral BID  . clopidogrel  75 mg Oral q morning - 10a  . furosemide  80 mg Intravenous BID  . heparin  5,000 Units Subcutaneous 3 times per day  . hydrALAZINE  25 mg Oral TID  . insulin aspart  0-5 Units Subcutaneous QHS  . insulin aspart  0-9 Units Subcutaneous TID WC  . insulin glargine  20 Units Subcutaneous QHS  . irbesartan  300 mg Oral Daily  . isosorbide mononitrate  60 mg Oral Daily   And  . isosorbide mononitrate  30 mg Oral QHS  . levothyroxine  150 mcg Oral QAC breakfast  . omega-3 acid ethyl esters  1 g Oral Daily  . sodium chloride  3 mL Intravenous Q12H  . sodium chloride  3 mL Intravenous Q12H  . vitamin C  500 mg Oral Daily   Continuous Infusions:  PRN Meds:.nitroGLYCERIN, ondansetron **OR** ondansetron (ZOFRAN) IV, polyethylene glycol     Results for orders placed or performed during the hospital encounter of 06/20/14 (from the past 48 hour(s))  Glucose, capillary     Status: Abnormal   Collection Time: 06/20/14 10:01 PM  Result Value Ref Range   Glucose-Capillary 101 (H) 65 - 99 mg/dL  Comprehensive metabolic panel     Status: Abnormal   Collection Time: 06/21/14  5:47  AM  Result Value Ref Range   Sodium 140 135 - 145 mmol/L   Potassium 3.3 (L) 3.5 - 5.1 mmol/L   Chloride 107 101 - 111 mmol/L   CO2 25 22 - 32 mmol/L   Glucose, Bld 86 65 - 99 mg/dL   BUN 50 (H) 6 - 20 mg/dL   Creatinine, Ser 3.13 (H) 0.44 - 1.00 mg/dL   Calcium 8.6 (L) 8.9 - 10.3 mg/dL   Total Protein 5.6 (L) 6.5 - 8.1 g/dL   Albumin 2.3 (L) 3.5 - 5.0 g/dL   AST 18 15 - 41 U/L   ALT 21 14 - 54 U/L   Alkaline Phosphatase 32 (L) 38 - 126 U/L   Total Bilirubin 1.4 (H) 0.3 - 1.2 mg/dL   GFR calc non Af Amer 14 (L) >60 mL/min   GFR calc Af Amer 16 (L) >60 mL/min    Comment: (NOTE) The eGFR has been calculated using the CKD EPI equation. This calculation has not been validated in all clinical situations. eGFR's persistently <60 mL/min signify possible Chronic Kidney Disease.    Anion gap 8 5 - 15  CBC     Status: Abnormal   Collection Time: 06/21/14  5:47 AM  Result Value Ref Range   WBC 5.1 4.0 - 10.5 K/uL   RBC 3.03 (L) 3.87 - 5.11 MIL/uL   Hemoglobin 9.7 (L) 12.0 - 15.0 g/dL   HCT 29.4 (L) 36.0 - 46.0 %   MCV 97.0 78.0 - 100.0 fL   MCH 32.0 26.0 - 34.0 pg   MCHC 33.0 30.0 - 36.0 g/dL   RDW 13.2 11.5 - 15.5 %   Platelets 210 150 - 400 K/uL  Glucose, capillary     Status: Abnormal   Collection Time: 06/21/14  7:32 AM  Result Value Ref Range   Glucose-Capillary 102 (H) 65 - 99 mg/dL   Comment 1 Notify RN    Comment 2 Document in Chart  Glucose, capillary     Status: Abnormal   Collection Time: 06/21/14 11:53 AM  Result Value Ref Range   Glucose-Capillary 130 (H) 65 - 99 mg/dL   Comment 1 Notify RN    Comment 2 Document in Chart   Glucose, capillary     Status: Abnormal   Collection Time: 06/21/14  4:29 PM  Result Value Ref Range   Glucose-Capillary 169 (H) 65 - 99 mg/dL   Comment 1 Notify RN    Comment 2 Document in Chart   Glucose, capillary     Status: Abnormal   Collection Time: 06/21/14  9:06 PM  Result Value Ref Range   Glucose-Capillary 232 (H) 65 - 99  mg/dL   Comment 1 Notify RN    Comment 2 Document in Chart   Glucose, capillary     Status: None   Collection Time: 06/22/14  7:43 AM  Result Value Ref Range   Glucose-Capillary 95 65 - 99 mg/dL  Vitamin B12     Status: None   Collection Time: 06/22/14  9:57 AM  Result Value Ref Range   Vitamin B-12 701 180 - 914 pg/mL    Comment: (NOTE) This assay is not validated for testing neonatal or myeloproliferative syndrome specimens for Vitamin B12 levels. Performed at [facility]   C-reactive protein     Status: None   Collection Time: 06/22/14  9:57 AM  Result Value Ref Range   CRP 0.7 <1.0 mg/dL    Comment: Performed at [facility]  Sedimentation rate     Status: Abnormal   Collection Time: 06/22/14  9:57 AM  Result Value Ref Range   Sed Rate 35 (H) 0 - 22 mm/hr  Glucose, capillary     Status: Abnormal   Collection Time: 06/22/14 11:08 AM  Result Value Ref Range   Glucose-Capillary 125 (H) 65 - 99 mg/dL  Basic metabolic panel     Status: Abnormal   Collection Time: 06/22/14 12:20 PM  Result Value Ref Range   Sodium 139 135 - 145 mmol/L   Potassium 3.5 3.5 - 5.1 mmol/L   Chloride 104 101 - 111 mmol/L   CO2 25 22 - 32 mmol/L   Glucose, Bld 145 (H) 65 - 99 mg/dL   BUN 51 (H) 6 - 20 mg/dL   Creatinine, Ser 3.11 (H) 0.44 - 1.00 mg/dL   Calcium 8.7 (L) 8.9 - 10.3 mg/dL   GFR calc non Af Amer 14 (L) >60 mL/min   GFR calc Af Amer 17 (L) >60 mL/min    Comment: (NOTE) The eGFR has been calculated using the CKD EPI equation. This calculation has not been validated in all clinical situations. eGFR's persistently <60 mL/min signify possible Chronic Kidney Disease.    Anion gap 10 5 - 15  Glucose, capillary     Status: Abnormal   Collection Time: 06/22/14  4:42 PM  Result Value Ref Range   Glucose-Capillary 118 (H) 65 - 99 mg/dL    Studies/Results:     Angelice Piech A. Merlene Laughter, M.D.  Diplomate, Tax adviser of Psychiatry and Neurology ( Neurology). 06/22/2014,  5:05 PM

## 2014-06-22 NOTE — Progress Notes (Signed)
[name]  MRN: [id number]  DOB/AGE: [birth year] 69 y.o.  Primary Care Physician:BUTLER, CYNTHIA, DO  Admit date: 06/20/2014  Chief Complaint:  Chief Complaint  Patient presents with  . Leg Swelling    S-Pt presented on  06/20/2014 with  Chief Complaint  Patient presents with  . Leg Swelling  .    Pt today feels better  meds   . amitriptyline  130 mg Oral QHS  . amLODipine  10 mg Oral Daily  . aspirin  81 mg Oral QHS  . azithromycin  500 mg Intravenous Q24H  . carvedilol  25 mg Oral BID WC  . cefTRIAXone (ROCEPHIN)  IV  1 g Intravenous Q24H  . cholecalciferol  2,000 Units Oral q morning - 10a  . cloNIDine  0.3 mg Oral BID  . clopidogrel  75 mg Oral q morning - 10a  . furosemide  80 mg Intravenous BID  . heparin  5,000 Units Subcutaneous 3 times per day  . hydrALAZINE  25 mg Oral TID  . insulin aspart  0-5 Units Subcutaneous QHS  . insulin aspart  0-9 Units Subcutaneous TID WC  . insulin glargine  20 Units Subcutaneous QHS  . irbesartan  300 mg Oral Daily  . isosorbide mononitrate  60 mg Oral Daily   And  . isosorbide mononitrate  30 mg Oral QHS  . levothyroxine  150 mcg Oral QAC breakfast  . omega-3 acid ethyl esters  1 g Oral Daily  . sodium chloride  3 mL Intravenous Q12H  . sodium chloride  3 mL Intravenous Q12H  . vitamin C  500 mg Oral Daily      Physical Exam: Vital signs in last 24 hours: Temp:  [97.9 F (36.6 C)-98.7 F (37.1 C)] 97.9 F (36.6 C) (06/15 1343) Pulse Rate:  [54-66] 58 (06/15 1343) Resp:  [18-20] 18 (06/15 1343) BP: (139-160)/(62-101) 160/62 mmHg (06/15 1343) SpO2:  [93 %-95 %] 94 % (06/15 1343) Weight:  [172 lb 3.2 oz (78.109 kg)] 172 lb 3.2 oz (78.109 kg) (06/15 0431) Weight change: -7 lb 12.8 oz (-3.538 kg) Last BM Date: 06/21/14  Intake/Output from previous day: 06/14 0701 - 06/15 0700 In: 1010 [P.O.:960; IV Piggyback:50] Out: 600 [Urine:600] Total I/O In: 480 [P.O.:480] Out: 800 [Urine:800]   Physical  Exam: General- pt is awake,alert, oriented to time place and person Resp- No acute REsp distress,  Rhonchi +( better) CVS- S1S2 regular in rate and rhythm GIT- BS+, soft, NT, ND EXT- NO LE Edema, Cyanosis   Lab Results: CBC  Recent Labs  06/20/14 1540 06/21/14 0547  WBC 5.9 5.1  HGB 10.7* 9.7*  HCT 32.9* 29.4*  PLT 225 210    BMET  Recent Labs  06/21/14 0547 06/22/14 1220  NA 140 139  K 3.3* 3.5  CL 107 104  CO2 25 25  GLUCOSE 86 145*  BUN 50* 51*  CREATININE 3.13* 3.11*  CALCIUM 8.6* 8.7*   Creat Trend 2016 3.3=>3.1  3.0--3.3 ( baseline) 2015 2.8 2014 1.9--2.3 2013 1.6 2012 1.4--1.5   MICRO No results found for this or any previous visit (from the past 240 hour(s)).    Lab Results  Component Value Date   CALCIUM 8.7* 06/22/2014               Impression: 1)Renal AKI secondary to Cardiorenal  AKi vs CKD progression  CKD stage 4 .  CKD since 2012  CKD secondary to DM/HTN  Progression of CKD As expected for DM  Proteinura will check.  2)HTN Medication- On RAS blockers. On Diuretics. On Calcium Channel Blockers On Alpha and beta Blockers . On Vasodilators. On [employer].  3)Anemia HGb at goal (9--11)   4)CKD Mineral-Bone Disorder PTH acceptable. Secondary Hyperparathyroidism present . Phosphorus at goal. Vitamin 25-OH low-on replcement   5)ID-admitted with cough, pneumonia On IV ABx Primary MD following  6)Electrolytes Hypokalemic     better NOrmonatremic   7)Acid base Co2 at goal   Plan:  Will continue current care      [place] S 06/22/2014, 3:52 PM

## 2014-06-22 NOTE — Progress Notes (Signed)
PHARMACIST - PHYSICIAN COMMUNICATION DR:   Roderic Palau CONCERNING: Antibiotic IV to Oral Route Change Policy  RECOMMENDATION: This patient is receiving Zithromax by the intravenous route.  Based on criteria approved by the Pharmacy and Therapeutics Committee, the antibiotic(s) is/are being converted to the equivalent oral dose form(s).   DESCRIPTION: These criteria include:  Patient being treated for a respiratory tract infection, urinary tract infection, cellulitis or clostridium difficile associated diarrhea if on metronidazole  The patient is not neutropenic and does not exhibit a GI malabsorption state  The patient is eating (either orally or via tube) and/or has been taking other orally administered medications for a least 24 hours  The patient is improving clinically and has a Tmax < 100.5  If you have questions about this conversion, please contact the Pharmacy Department  [x]   [phone] )  [facility] []   [phone] )  [facility] []   [phone] )  [facility] []   [phone] )  [facility] []   [phone] )  [place], PharmD, BCPS 06/22/2014@10 :35 AM

## 2014-06-23 DIAGNOSIS — J189 Pneumonia, unspecified organism: Secondary | ICD-10-CM | POA: Insufficient documentation

## 2014-06-23 DIAGNOSIS — R05 Cough: Secondary | ICD-10-CM

## 2014-06-23 DIAGNOSIS — R29898 Other symptoms and signs involving the musculoskeletal system: Secondary | ICD-10-CM

## 2014-06-23 LAB — BASIC METABOLIC PANEL
Anion gap: 10 (ref 5–15)
BUN: 49 mg/dL — ABNORMAL HIGH (ref 6–20)
CO2: 27 mmol/L (ref 22–32)
Calcium: 9.1 mg/dL (ref 8.9–10.3)
Chloride: 103 mmol/L (ref 101–111)
Creatinine, Ser: 2.76 mg/dL — ABNORMAL HIGH (ref 0.44–1.00)
GFR calc Af Amer: 19 mL/min — ABNORMAL LOW (ref >60)
GFR calc non Af Amer: 16 mL/min — ABNORMAL LOW (ref >60)
Glucose, Bld: 68 mg/dL (ref 65–99)
Potassium: 3.2 mmol/L — ABNORMAL LOW (ref 3.5–5.1)
Sodium: 140 mmol/L (ref 135–145)

## 2014-06-23 LAB — CK TOTAL AND CKMB (NOT AT ARMC)
CK, MB: 3.7 ng/mL (ref 0.5–5.0)
Relative Index: INVALID (ref 0.0–2.5)
Total CK: 61 U/L (ref 38–234)

## 2014-06-23 LAB — CBC
HCT: 31.6 % — ABNORMAL LOW (ref 36.0–46.0)
Hemoglobin: 10.3 g/dL — ABNORMAL LOW (ref 12.0–15.0)
MCH: 31.5 pg (ref 26.0–34.0)
MCHC: 32.6 g/dL (ref 30.0–36.0)
MCV: 96.6 fL (ref 78.0–100.0)
Platelets: 253 10*3/uL (ref 150–400)
RBC: 3.27 MIL/uL — ABNORMAL LOW (ref 3.87–5.11)
RDW: 13.1 % (ref 11.5–15.5)
WBC: 5.3 10*3/uL (ref 4.0–10.5)

## 2014-06-23 LAB — HOMOCYSTEINE: Homocysteine: 38.6 umol/L — ABNORMAL HIGH (ref 0.0–15.0)

## 2014-06-23 LAB — GLUCOSE, CAPILLARY
GLUCOSE-CAPILLARY: 57 mg/dL — AB (ref 65–99)
Glucose-Capillary: 80 mg/dL (ref 65–99)
Glucose-Capillary: 140 mg/dL — ABNORMAL HIGH (ref 65–99)

## 2014-06-23 MED ORDER — INSULIN GLARGINE 100 UNIT/ML ~~LOC~~ SOLN
10.0000 [IU] | Freq: Every day | SUBCUTANEOUS | Status: DC
  Filled 2014-06-23: qty 0.1

## 2014-06-23 MED ORDER — TORSEMIDE 10 MG PO TABS: 50.0000 mg | ORAL_TABLET | Freq: Every day | ORAL | Status: AC

## 2014-06-23 MED ORDER — LEVOFLOXACIN 500 MG PO TABS: 500.0000 mg | ORAL_TABLET | ORAL | Status: DC

## 2014-06-23 MED ORDER — TORSEMIDE 20 MG PO TABS
50.0000 mg | ORAL_TABLET | Freq: Every day | ORAL | Status: DC
  Administered 2014-06-23: 50 mg via ORAL
  Filled 2014-06-23: qty 3

## 2014-06-23 MED ORDER — INSULIN GLARGINE 100 UNIT/ML ~~LOC~~ SOLN: 15.0000 [IU] | Freq: Every day | SUBCUTANEOUS | Status: AC

## 2014-06-23 NOTE — Care Management Note (Signed)
Case Management Note  Patient Details  Name: [name] MRN: [id number] Date of Birth: 25-May-1945  Subjective/Objective:                    Action/Plan:   Expected Discharge Date:  06/24/14               Expected Discharge Plan:  Skilled Nursing Facility  In-House Referral:  Clinical Social Work  Discharge planning Services  CM Consult  Post Acute Care Choice:    Choice offered to:     DME Arranged:    DME Agency:     HH Arranged:    [place] Agency:     Status of Service:  Completed, signed off  Medicare Important Message Given:  Yes Date Medicare IM Given:  06/23/14 Medicare IM give by:    Date Additional Medicare IM Given:    Additional Medicare Important Message give by:     If discussed at [place] of Stay Meetings, dates discussed:    Additional Comments: Pt discharged to [facility] SNF today. CSW to arrange discharge to facility. Christinia Gully [place], RN 06/23/2014, 10:15 AM

## 2014-06-23 NOTE — Discharge Summary (Addendum)
Physician Discharge Summary  MERDIS SNODGRASS BMW:[id number] DOB: 12/27/1945 DOA: 06/20/2014  PCP: Octavio Graves, DO  Admit date: 06/20/2014 Discharge date: 06/23/2014  Time spent: 40 minutes  Recommendations for Outpatient Follow-up:  1. Repeat CT chest in 2-3 weeks to ensure resolution of lung findings and no underlying malignancy 2. Patient will be discharged to SNF 3. Follow up with Dr. Lowanda Foster in 3 weeks 4. Repeat BMET in 1 week with results sent to Dr. Lowanda Foster  Discharge Diagnoses:  Active Problems:   Essential hypertension, benign   CKD (chronic kidney disease) stage 4, GFR 15-29 ml/min   Cough   Weakness   Leg edema   Diabetes   History of melanoma  community acquired pneumonia  Abnormal CT chest  Discharge Condition: improving  Diet recommendation: low salt, low carb, fluid restrict to 1200cc/day  Filed Weights   06/21/14 0628 06/22/14 0431 06/23/14 0555  Weight: 78.7 kg (173 lb 8 oz) 78.109 kg (172 lb 3.2 oz) 77.747 kg (171 lb 6.4 oz)    History of present illness:  This patient presents to the hospital with cough, lower shotty edema. She denied any dyspnea, orthopnea, chest pain or palpitations. Evaluation in the emergency room included CT scan of the chest that showed multiple areas of nodules possibly infective in origin with small bilateral pleural effusions. There was concerns for developing pneumonia and therefore she was admitted for further treatments.  Hospital Course:  Patient was started on Rocephin and azithromycin. She has been afebrile and has not had any leukocytosis. She continues to have a mild cough. She will need repeat CT scan of her chest in 2-3 weeks to ensure resolution of these nodules and to rule out any underlying malignancy.  Patient was continued on diuretics for lower extremity edema. She has a history of chronic kidney disease and is followed by nephrology. Creatinine appears to be stable on diuretics. She'll transition from Lasix to  Demadex. She does still have some large edema, but is improving. She'll follow-up with nephrology in 3 weeks. She'll need a repeat basic metabolic panel in 1 week to ensure that her renal function stable.  Regarding her bilateral lower extremity leg weakness. MRI lumbar spine did not show any acute findings. She was seen by neurology who felt that her leg weakness was likely multifactorial related to her degenerative disease, neuropathy, deconditioning. She was seen by physical therapy recommended skilled nursing facility placement. The patient stable for discharge today.  Procedures:    Consultations:  Nephrology  Neurology      Discharge Exam: Filed Vitals:   06/23/14 0555  BP: 186/67  Pulse: 56  Temp: 98.1 F (36.7 C)  Resp: 18    General: NAD Cardiovascular: S1, S2 RRR Respiratory: CTA B  Discharge Instructions   Discharge Instructions    Diet - low sodium heart healthy    Complete by:  As directed      Increase activity slowly    Complete by:  As directed           Current Discharge Medication List    START taking these medications   Details  levofloxacin (LEVAQUIN) 500 MG tablet Take 1 tablet (500 mg total) by mouth every other day. For 4 more days    torsemide (DEMADEX) 10 MG tablet Take 5 tablets (50 mg total) by mouth daily.      CONTINUE these medications which have CHANGED   Details  insulin glargine (LANTUS) 100 UNIT/ML injection Inject 0.15 mLs (15 Units total)  into the skin at bedtime. *Based on blood sugar levels Qty: 10 mL, Refills: 11      CONTINUE these medications which have NOT CHANGED   Details  !! amitriptyline (ELAVIL) 10 MG tablet Take 30 mg by mouth at bedtime. Takes in addition to the 100mg  at bedtime.    !! amitriptyline (ELAVIL) 100 MG tablet Take 100 mg by mouth at bedtime.      amLODipine (NORVASC) 10 MG tablet Take 10 mg by mouth daily.      aspirin 81 MG tablet Take 81 mg by mouth at bedtime.     carvedilol (COREG) 25  MG tablet Take 25 mg by mouth 2 (two) times daily.      Cholecalciferol (VITAMIN D) 2000 UNITS CAPS Take 1 capsule by mouth every morning.     cloNIDine (CATAPRES) 0.3 MG tablet Take 0.3 mg by mouth 2 (two) times daily.      clopidogrel (PLAVIX) 75 MG tablet Take 75 mg by mouth every morning.     hydrALAZINE (APRESOLINE) 25 MG tablet Take 25 mg by mouth 3 (three) times daily.    insulin glulisine (APIDRA) 100 UNIT/ML injection Inject 5 Units into the skin 2 (two) times daily after a meal. After breakfast & lunch.    isosorbide mononitrate (IMDUR) 60 MG 24 hr tablet TAKE 60MG  IN THE AM AND 30MG  IN THE PM (TAKE HALF TABLET IF SCORED) Qty: 135 tablet, Refills: 6    levothyroxine (SYNTHROID, LEVOTHROID) 150 MCG tablet Take 150 mcg by mouth daily before breakfast.    nitroGLYCERIN (NITROSTAT) 0.4 MG SL tablet Place 0.4 mg under the tongue every 5 (five) minutes as needed.      Omega-3 Fatty Acids (FISH OIL) 1000 MG CAPS Take 2-3 capsules by mouth 2 (two) times daily. Take 3 in am and 2 at bedtime    polyethylene glycol powder (MIRALAX) powder Take 17 g by mouth daily as needed for mild constipation. In 8 oz of water or juice    valsartan (DIOVAN) 320 MG tablet Take 320 mg by mouth every morning.     vitamin C (ASCORBIC ACID) 500 MG tablet Take 500 mg by mouth daily.      vitamin E 400 UNIT capsule Take 400 Units by mouth daily.       !! - Potential duplicate medications found. Please discuss with provider.    STOP taking these medications     furosemide (LASIX) 80 MG tablet      glyBURIDE micronized (GLYNASE) 6 MG tablet      nabumetone (RELAFEN) 750 MG tablet      simvastatin (ZOCOR) 40 MG tablet        Allergies  Allergen Reactions  . Sulfonamide Derivatives Swelling    REACTION: throat swelling   Follow-up Information    Follow up with [facility] S, MD In 3 weeks.   Specialty:  Nephrology   Contact information:   [address]  [address] [phone]        The results of significant diagnostics from this hospitalization (including imaging, microbiology, ancillary and laboratory) are listed below for reference.    Significant Diagnostic Studies: Dg Chest 2 View  06/20/2014   CLINICAL DATA:  Edema in feet, ankles and legs for 4-5 days, 8 lb weight gain in past 6 days, coughing when lying flat, history type II diabetes, MI, essential benign hypertension, history melanoma  EXAM: CHEST  2 VIEW  COMPARISON:  12/14/2012  FINDINGS: Normal heart size, mediastinal  contours, and pulmonary vascularity.  Question minimal infiltrate in both upper lobes and at LEFT base.  No pleural effusion or pneumothorax.  Bones unremarkable.  IMPRESSION: Suspect minimal BILATERAL upper lobe and questionable LEFT basilar infiltrates concerning for pneumonia.  In light of history of melanoma, radiographic followup recommended in 3-4 weeks recommended to ensure resolution.   Electronically Signed   By: Lavonia Dana M.D.   On: 06/20/2014 16:32   Ct Chest Wo Contrast  06/20/2014   CLINICAL DATA:  Chest congestion, cough.  History of melanoma.  EXAM: CT CHEST WITHOUT CONTRAST  TECHNIQUE: Multidetector CT imaging of the chest was performed following the standard protocol without IV contrast.  COMPARISON:  Chest radiograph of same day.  FINDINGS: No pneumothorax is noted. Minimal bilateral pleural effusions are noted with left greater than right. Multiple nodular and airspace opacities are noted throughout both lungs. Largest nodular density measuring 11 mm is noted in superior segment of right lower lobe. 8 mm nodule is noted medial and inferior to this. Ill-defined airspace opacity is noted anteriorly in the left upper lobe as well as in right lung apex. Ill-defined nodular opacity is seen anterior to left major fissure in left upper lobe, with several smaller nodular densities seen laterally in the left upper lobe. Large airspace opacity is noted laterally in  right upper lobe most consistent with inflammation. Is uncertain if these findings represent multifocal pneumonia, or possibly metastatic disease given the history of melanoma. Coronary artery calcifications are noted suggesting coronary artery disease. Atherosclerosis of thoracic aorta is noted without aneurysm formation. No significant mediastinal mass or adenopathy is noted. Visualized portion of upper abdomen appears normal. No significant osseous abnormality is noted in the chest.  IMPRESSION: Minimal bilateral pleural effusions with left greater than right.  Multiple nodular and airspace opacities are noted throughout both lung which may represent multifocal pneumonia, but metastatic disease cannot be excluded given the history of melanoma. Follow-up CT scan in 2-3 weeks is recommended to ensure resolution of these abnormalities. If these abnormalities persist on followup CT scan, this would be suspicious for metastatic disease.   Electronically Signed   By: Marijo Conception, M.D.   On: 06/20/2014 18:05   Mr Lumbar Spine Wo Contrast  06/21/2014   CLINICAL DATA:  Leg weakness with decreased ambulation for 1 year. No acute injury or prior relevant surgery. History of chronic kidney disease and diabetes. Initial encounter.  EXAM: MRI LUMBAR SPINE WITHOUT CONTRAST  TECHNIQUE: Multiplanar, multisequence MR imaging of the lumbar spine was performed. No intravenous contrast was administered.  COMPARISON:  Lumbar MRI 07/08/2012.  FINDINGS: Five lumbar type vertebral bodies are assumed. The alignment is normal. There is no evidence of acute fracture or pars defect. There is progressive diffuse marrow heterogeneity attributed to renal osteodystrophy or anemia. No focally suspicious lesions identified on inversion recovery images. Chronic discal hyperintensity again noted at L5-S1. Adjacent type 2 endplate changes are stable. There is no evidence of endplate destruction.  The conus medullaris extends to the L1-2 level  and appears normal. No focal paraspinal abnormalities identified. There is generalized mesenteric, subcutaneous and retroperitoneal soft tissue edema.  No significant disc space findings from T11-12 through L1-2.  L2-3: With minimally progressive annular disc bulging eccentric to the left. The resulting borderline spinal stenosis is unchanged. There is no foraminal compromise or definite nerve root encroachment.  L3-4: Stable mild disc bulging, facet and ligamentous hypertrophy. No spinal stenosis or nerve root encroachment.  L4-5: Mildly progressive annular  disc bulging, facet and ligamentous hypertrophy. The resulting central stenosis is slightly progressive, although still moderate. There is mild narrowing of the lateral recesses, left greater than right. The foramina are patent.  L5-S1: Slowly progressive chronic disc and endplate degeneration. Chronic disc fragment in the right subarticular zone is unchanged. This may contribute to chronic right S1 nerve root encroachment. There is stable mild foraminal narrowing bilaterally due to disc bulging and osteophytes.  IMPRESSION: 1. Compared with the prior study from 2 years ago, there progressive diffuse marrow changes attributed to renal failure/anemia. No acute osseous findings. 2. No acute findings demonstrated within the lumbar spine. 3. Mildly progressive annular disc bulging, facet and ligamentous hypertrophy at L4-5 contributing to moderate multifactorial spinal stenosis and mild narrowing of the lateral recesses. 4. Mildly progressive chronic disc and endplate degeneration at L5-S1 with stable small disc fragment in the right subarticular zone.   Electronically Signed   By: Richardean Sale M.D.   On: 06/21/2014 11:19    Microbiology: No results found for this or any previous visit (from the past 240 hour(s)).   Labs: Basic Metabolic Panel:  Recent Labs Lab 06/20/14 1540 06/21/14 0547 06/22/14 1220 06/23/14 0623  NA 138 140 139 140  K 3.5 3.3*  3.5 3.2*  CL 104 107 104 103  CO2 24 25 25 27   GLUCOSE 139* 86 145* 68  BUN 55* 50* 51* 49*  CREATININE 3.36* 3.13* 3.11* 2.76*  CALCIUM 9.0 8.6* 8.7* 9.1   Liver Function Tests:  Recent Labs Lab 06/21/14 0547  AST 18  ALT 21  ALKPHOS 32*  BILITOT 1.4*  PROT 5.6*  ALBUMIN 2.3*   No results for input(s): LIPASE, AMYLASE in the last 168 hours. No results for input(s): AMMONIA in the last 168 hours. CBC:  Recent Labs Lab 06/20/14 1540 06/21/14 0547 06/23/14 0623  WBC 5.9 5.1 5.3  NEUTROABS 3.7  --   --   HGB 10.7* 9.7* 10.3*  HCT 32.9* 29.4* 31.6*  MCV 97.1 97.0 96.6  PLT 225 210 253   Cardiac Enzymes:  Recent Labs Lab 06/20/14 1540  TROPONINI <0.03   BNP: BNP (last 3 results)  Recent Labs  06/20/14 1540  BNP 267.0*    ProBNP (last 3 results) No results for input(s): PROBNP in the last 8760 hours.  CBG:  Recent Labs Lab 06/22/14 1108 06/22/14 1642 06/22/14 2104 06/23/14 0736 06/23/14 0802  GLUCAP 125* 118* 211* 57* 80       Signed:  Mikele Sifuentes  [facility] 06/23/2014, 10:01 AM

## 2014-06-23 NOTE — Progress Notes (Signed)
Subjective: Patient is feeling much better. Presently still she has cough but nonproductive. She denies any difficulty breathing. Presently she doesn't have also any nausea or vomiting.   Objective: Vital signs in last 24 hours: Temp:  [97.9 F (36.6 C)-98.6 F (37 C)] 98.1 F (36.7 C) (06/16 0555) Pulse Rate:  [56-63] 56 (06/16 0555) Resp:  [18-20] 18 (06/16 0555) BP: (160-186)/(62-70) 186/67 mmHg (06/16 0555) SpO2:  [94 %-96 %] 95 % (06/16 0555) Weight:  [171 lb 6.4 oz (77.747 kg)] 171 lb 6.4 oz (77.747 kg) (06/16 0555)  Intake/Output from previous day: 06/15 0701 - 06/16 0700 In: 720 [P.O.:720] Out: 1900 [Urine:1900] Intake/Output this shift: Total I/O In: 480 [P.O.:480] Out: -    Recent Labs  06/20/14 1540 06/21/14 0547 06/23/14 0623  HGB 10.7* 9.7* 10.3*    Recent Labs  06/21/14 0547 06/23/14 0623  WBC 5.1 5.3  RBC 3.03* 3.27*  HCT 29.4* 31.6*  PLT 210 253    Recent Labs  06/22/14 1220 06/23/14 0623  NA 139 140  K 3.5 3.2*  CL 104 103  CO2 25 27  BUN 51* 49*  CREATININE 3.11* 2.76*  GLUCOSE 145* 68  CALCIUM 8.7* 9.1   No results for input(s): LABPT, INR in the last 72 hours.  Generally she is alert and in no apparent distress. Chest: Decreased breath sound otherwise no rales rhonchi or egophony Heart exam regular rate and rhythm no murmur Abdomen: Soft positive bowel sound Extremities no edema  Assessment/Plan: Problem #1 acute kidney injury superimposed on chronic. Presently her BUN and creatinine seems to be improving and returning to her baseline. The etiology could be secondary to prerenal syndrome/ATN/cardiorenal. Problem #2 chronic renal failure: Stage IV. Etiology was thought to be secondary to diabetes/hypertension/cardiorenal. Presently patient doesn't have any uremic sinus symptoms. Problem #3 hypokalemia: Possibly from diuretics Problem #4 anemia: Her hemoglobin is within our target goal Problem #5 pneumonia: Presently she is on  until about antibiotics and feeling much better.  Problem #6 hypertension: Her blood pressure is reasonably controlled Problem #7 history of diabetes Plan: 1] We'll DC Lasix 2] restart patient on Demadex 40 mg by mouth daily 3] will give her KCl 40 mEq by mouth 2 4] we'll check her basic metabolic panel in the morning 5] will follow patient in 3 weeks when she is discharged.   Harini Dearmond S 06/23/2014, 9:04 AM

## 2014-06-23 NOTE — Clinical Social Work Placement (Signed)
   CLINICAL SOCIAL WORK PLACEMENT  NOTE  Date:  06/23/2014  Patient Details  Name: [name] MRN: [id number] Date of Birth: 14-Dec-1945  Clinical Social Work is seeking post-discharge placement for this patient at the [place] level of care (*CSW will initial, date and re-position this form in  chart as items are completed):  Yes   Patient/family provided with [facility] Work Department's list of facilities offering this level of care within the geographic area requested by the patient (or if unable, by the patient's family).  Yes   Patient/family informed of their freedom to choose among providers that offer the needed level of care, that participate in Medicare, Medicaid or managed care program needed by the patient, have an available bed and are willing to accept the patient.  Yes   Patient/family informed of [facility]'s ownership interest in [facility] and [facility], as well as of the fact that they are under no obligation to receive care at these facilities.  PASRR submitted to EDS on       PASRR number received on       Existing PASRR number confirmed on       FL2 transmitted to all facilities in geographic area requested by pt/family on 06/21/14     FL2 transmitted to all facilities within larger geographic area on       Patient informed that his/her managed care company has contracts with or will negotiate with certain facilities, including the following:        Yes   Patient/family informed of bed offers received.  Patient chooses bed at [facility] and [facility]     Physician recommends and patient chooses bed at      Patient to be transferred to [facility] on 06/23/14.  Patient to be transferred to facility by husband     Patient family notified on 06/23/14 of transfer.  Name of family member notified:  Jeneen Rinks- husband     PHYSICIAN       Additional Comment:     _______________________________________________ Salome Arnt, LCSW 06/23/2014, 10:19 AM [phone]

## 2014-06-23 NOTE — Progress Notes (Signed)
[name] discharged [name] per MD order.  Report called to receiving [name] Sidles, RN at 1330.     Medication List    STOP taking these medications        furosemide 80 MG tablet  Commonly known as:  LASIX     glyBURIDE micronized 6 MG tablet  Commonly known as:  GLYNASE     nabumetone 750 MG tablet  Commonly known as:  RELAFEN     simvastatin 40 MG tablet  Commonly known as:  ZOCOR      TAKE these medications        amitriptyline 100 MG tablet  Commonly known as:  ELAVIL  Take 100 mg by mouth at bedtime.     amitriptyline 10 MG tablet  Commonly known as:  ELAVIL  Take 30 mg by mouth at bedtime. Takes in addition to the 100mg  at bedtime.     amLODipine 10 MG tablet  Commonly known as:  NORVASC  Take 10 mg by mouth daily.     APIDRA 100 UNIT/ML injection  Generic drug:  insulin glulisine  Inject 5 Units into the skin 2 (two) times daily after a meal. After breakfast & lunch.     aspirin 81 MG tablet  Take 81 mg by mouth at bedtime.     carvedilol 25 MG tablet  Commonly known as:  COREG  Take 25 mg by mouth 2 (two) times daily.     cloNIDine 0.3 MG tablet  Commonly known as:  CATAPRES  Take 0.3 mg by mouth 2 (two) times daily.     clopidogrel 75 MG tablet  Commonly known as:  PLAVIX  Take 75 mg by mouth every morning.     Fish Oil 1000 MG Caps  Take 2-3 capsules by mouth 2 (two) times daily. Take 3 in am and 2 at bedtime     hydrALAZINE 25 MG tablet  Commonly known as:  APRESOLINE  Take 25 mg by mouth 3 (three) times daily.     insulin glargine 100 UNIT/ML injection  Commonly known as:  LANTUS  Inject 0.15 mLs (15 Units total) into the skin at bedtime. *Based on blood sugar levels     isosorbide mononitrate 60 MG 24 hr tablet  Commonly known as:  IMDUR  TAKE 60MG  IN THE AM AND 30MG  IN THE PM (TAKE HALF TABLET IF SCORED)     levofloxacin 500 MG tablet  Commonly known as:  LEVAQUIN  Take 1 tablet (500 mg total) by mouth every other day. For 4  more days     levothyroxine 150 MCG tablet  Commonly known as:  SYNTHROID, LEVOTHROID  Take 150 mcg by mouth daily before breakfast.     MIRALAX powder  Generic drug:  polyethylene glycol powder  Take 17 g by mouth daily as needed for mild constipation. In 8 oz of water or juice     nitroGLYCERIN 0.4 MG SL tablet  Commonly known as:  NITROSTAT  Place 0.4 mg under the tongue every 5 (five) minutes as needed.     torsemide 10 MG tablet  Commonly known as:  DEMADEX  Take 5 tablets (50 mg total) by mouth daily.     valsartan 320 MG tablet  Commonly known as:  DIOVAN  Take 320 mg by mouth every morning.     vitamin C 500 MG tablet  Commonly known as:  ASCORBIC ACID  Take 500 mg by mouth daily.     Vitamin D 2000  UNITS Caps  Take 1 capsule by mouth every morning.     vitamin E 400 UNIT capsule  Take 400 Units by mouth daily.         IV site discontinued and catheter remains intact. Site without signs and symptoms of complications. Dressing and pressure applied.  Patient transported via wheelchair to car with husband by [place], NT,  no distress noted upon discharge.  [name] 06/23/2014 1:18 PM

## 2014-06-23 NOTE — Progress Notes (Signed)
Hypoglycemic Event  CBG: 57  Treatment: 15 GM carbohydrate snack and breakfast  Symptoms: None  Follow-up CBG: Time: 0805 CBG Result:80  Possible Reasons for Event: Unknown  Comments/MD notified: Dr. Telford Nab, Venita Sheffield  Remember to initiate Hypoglycemia Order Set & complete

## 2014-06-24 LAB — RPR: RPR Ser Ql: NONREACTIVE

## 2014-06-24 LAB — ANTINUCLEAR ANTIBODIES, IFA: ANA Ab, IFA: NEGATIVE

## 2014-07-18 ENCOUNTER — Ambulatory Visit (INDEPENDENT_AMBULATORY_CARE_PROVIDER_SITE_OTHER): Payer: Medicare Other | Admitting: Cardiology

## 2014-07-18 ENCOUNTER — Encounter: Payer: Self-pay | Admitting: Cardiology

## 2014-07-18 VITALS — BP 143/70 | HR 56 | Ht 63.0 in | Wt 170.8 lb

## 2014-07-18 DIAGNOSIS — I10 Essential (primary) hypertension: Secondary | ICD-10-CM | POA: Diagnosis not present

## 2014-07-18 DIAGNOSIS — I5032 Chronic diastolic (congestive) heart failure: Secondary | ICD-10-CM | POA: Diagnosis not present

## 2014-07-18 DIAGNOSIS — N184 Chronic kidney disease, stage 4 (severe): Secondary | ICD-10-CM | POA: Diagnosis not present

## 2014-07-18 DIAGNOSIS — I2510 Atherosclerotic heart disease of native coronary artery without angina pectoris: Secondary | ICD-10-CM | POA: Diagnosis not present

## 2014-07-18 MED ORDER — NITROGLYCERIN 0.4 MG SL SUBL: 0.4000 mg | SUBLINGUAL_TABLET | SUBLINGUAL | Status: AC | PRN

## 2014-07-18 NOTE — Progress Notes (Signed)
.    Cardiology Office Note  Date: 07/18/2014   ID: [name], DOB [birth year], MRN [id number]  PCP: Octavio Graves, DO  Primary Cardiologist: Rozann Lesches, MD   Chief Complaint  Patient presents with  . Coronary Artery Disease  . Hospitalization Follow-up  . Hypertension    History of Present Illness: [name] is a 69 y.o. female last seen in January. She was admitted to [name] in June with progressive shortness of breath, cough, and leg edema. There was concern about potentially developing pneumonia by chest CT and she was treated with antibiotics. Also seen by nephrology and transition from Lasix to [facility] for component of volume overload. She has CKD, stage 4. She reports undergoing rehabilitation in [name] for 3 weeks, now is back home.  She does state that she feels much better, no further cough or worsening shortness of breath. No fevers or chills. Her overall body edema is better but she still has significant leg edema, slowly improving on current dose of Demadex. She will be seeing Dr. Lowanda Foster later this month.  Her last creatinine was 2.7, down from 3.3 at peak.  She does not report any angina symptoms.   Past Medical History  Diagnosis Date  . Fibromyalgia   . Hypothyroidism   . Diabetes mellitus, type 2   . Coronary atherosclerosis of native coronary artery     BMS circ/RCA 2001, BMS RCA 2003, PTCA RCA/ramus 2004, DES RCA 2005, DES RCA 2007, DES OM/LAD 2009, DES RCA 7/11, LVEF 65%  . Hyperlipidemia   . Essential hypertension, benign   . MI (myocardial infarction)   . Melanoma     Past Surgical History  Procedure Laterality Date  . Cholecystectomy    . Total abdominal hysterectomy    . Bladder suspension    . Neuroplasty / transposition median nerve at carpal tunnel bilateral    . Coronary stent placement    . Tumor removal      Current Outpatient Prescriptions  Medication Sig Dispense Refill  . amitriptyline (ELAVIL) 10 MG  tablet Take 30 mg by mouth at bedtime. Takes in addition to the 100mg  at bedtime.    Marland Kitchen amitriptyline (ELAVIL) 100 MG tablet Take 100 mg by mouth at bedtime.      Marland Kitchen amLODipine (NORVASC) 10 MG tablet Take 10 mg by mouth daily.      Marland Kitchen aspirin 81 MG tablet Take 81 mg by mouth at bedtime.     . carvedilol (COREG) 25 MG tablet Take 25 mg by mouth 2 (two) times daily.      . Cholecalciferol (VITAMIN D) 2000 UNITS CAPS Take 1 capsule by mouth every morning.     . cloNIDine (CATAPRES) 0.3 MG tablet Take 0.3 mg by mouth 2 (two) times daily.      . clopidogrel (PLAVIX) 75 MG tablet Take 75 mg by mouth every morning.     Marland Kitchen co-enzyme Q-10 30 MG capsule Take 30 mg by mouth daily.    . hydrALAZINE (APRESOLINE) 25 MG tablet Take 25 mg by mouth 3 (three) times daily.    . insulin glargine (LANTUS) 100 UNIT/ML injection Inject 0.15 mLs (15 Units total) into the skin at bedtime. *Based on blood sugar levels 10 mL 11  . insulin glulisine (APIDRA) 100 UNIT/ML injection Inject 5 Units into the skin 2 (two) times daily after a meal. After breakfast & lunch.    . isosorbide mononitrate (IMDUR) 60 MG 24 hr tablet TAKE 60MG  IN THE  AM AND 30MG  IN THE PM (TAKE HALF TABLET IF SCORED) (Patient taking differently: Take 30-60 mg by mouth 2 (two) times daily. TAKE 60MG  IN THE AM AND 30MG  IN THE PM (TAKE HALF TABLET IF SCORED)) 135 tablet 6  . levothyroxine (SYNTHROID, LEVOTHROID) 150 MCG tablet Take 150 mcg by mouth daily before breakfast.    . metolazone (ZAROXOLYN) 5 MG tablet Take 2.5 mg by mouth daily. As needed.    . nitroGLYCERIN (NITROSTAT) 0.4 MG SL tablet Place 1 tablet (0.4 mg total) under the tongue every 5 (five) minutes x 3 doses as needed. 25 tablet 3  . Omega-3 Fatty Acids (FISH OIL) 1000 MG CAPS Take 2-3 capsules by mouth 2 (two) times daily. Take 3 in am and 2 at bedtime    . polyethylene glycol powder (MIRALAX) powder Take 17 g by mouth daily as needed for mild constipation. In 8 oz of water or juice    .  torsemide (DEMADEX) 10 MG tablet Take 5 tablets (50 mg total) by mouth daily. (Patient taking differently: Take 50 mg by mouth 2 (two) times daily. )    . valsartan (DIOVAN) 320 MG tablet Take 320 mg by mouth every morning.     . vitamin C (ASCORBIC ACID) 500 MG tablet Take 500 mg by mouth daily.      . vitamin E 400 UNIT capsule Take 400 Units by mouth daily.       No current facility-administered medications for this visit.    Allergies:  Sulfonamide derivatives   Social History: The patient  reports that she has never smoked. She has never used smokeless tobacco. She reports that she does not drink alcohol or use illicit drugs.   ROS:  Please see the history of present illness. Otherwise, complete review of systems is positive for leg edema.  All other systems are reviewed and negative.   Physical Exam: VS:  BP 143/70 mmHg  Pulse 56  Ht 5\' 3"  (1.6 m)  Wt 170 lb 12.8 oz (77.474 kg)  BMI 30.26 kg/m2  SpO2 96%, BMI Body mass index is 30.26 kg/(m^2).  Wt Readings from Last 3 Encounters:  07/18/14 170 lb 12.8 oz (77.474 kg)  06/23/14 171 lb 6.4 oz (77.747 kg)  06/21/14 173 lb (78.472 kg)     Obese woman, appears comfortable at rest.  HEENT: Conjunctiva and lids normal, oropharynx clear with moist mucosa.  Neck: Supple, no elevated JVP or carotid bruits, no thyromegaly.  Lungs: Clear to auscultation, nonlabored breathing at rest.  Cardiac: Regular rate and rhythm, no S3, 2/6 basal systolic murmur, no pericardial rub.  Abdomen: Soft, protuberant, nontender, bowel sounds present, no guarding or rebound.  Extremities: 2+ edema, distal pulses 1+.  Skin: Warm and dry. Musculoskeletal: No kyphosis. Neuropsychiatric: Alert and oriented 3, affect appropriate.   ECG: Tracing from June 13 showed sinus rhythm with nonspecific ST-T changes.   Recent Labwork: 06/20/2014: B Natriuretic Peptide 267.0*; TSH 1.461 06/21/2014: ALT 21; AST 18 06/23/2014: BUN 49*; Creatinine, Ser 2.76*;  Hemoglobin 10.3*; Platelets 253; Potassium 3.2*; Sodium 140   Other Studies Reviewed Today:  Echocardiogram from August 2015 reported moderate to severe LVH with LVEF 60-65%, increased left atrial pressure, sclerotic aortic valve without stenosis, mild mitral regurgitation.  Assessment and Plan:  1. Chronic diastolic heart failure. Diuretics have been transitioned from Lasix to [facility] as outlined above. Her body edema is improving, but she still has significant residual leg edema. This does seem to be slowly improving, no changes  made to Demadex dose. Keep follow-up with Dr. Lowanda Foster. If creatinine remains stable, could consider further advancing Demadex if needed.  2. Ischemic heart disease status post multiple percutaneous interventions as outlined above. No recent angina.  3. Essential hypertension, no change to antihypertensives regimen.  4. CKD, stage 4. Keep follow-up with Dr. Lowanda Foster.  5. Pneumonia, community-acquired. She has had resolution of symptoms, and completed antibotics. Discharge summary from [facility] indicates plan for follow-up chest CT to ensure resolution of abnormal lung findings. She will be seeing Dr. Melina Copa soon, and testing can be arranged at that time (noncontrasted chest CT).  Current medicines were reviewed with the patient today.  Disposition: FU with me in 3 months.   Signed, Satira Sark, MD, [facility] 07/18/2014 1:35 PM    [facility] [place], [place], [place] [address] Phone: [phone]; Fax: [phone]

## 2014-07-18 NOTE — Patient Instructions (Signed)
Your physician recommends that you continue on your current medications as directed. Please refer to the Current Medication list given to you today. Your physician recommends that you schedule a follow-up appointment in: 3 months. You will receive a reminder letter in the mail in about 1-2 months reminding you to call and schedule your appointment. If you don't receive this letter, please contact our office. 

## 2014-10-07 ENCOUNTER — Other Ambulatory Visit: Payer: Self-pay | Admitting: *Deleted

## 2014-10-07 DIAGNOSIS — N184 Chronic kidney disease, stage 4 (severe): Secondary | ICD-10-CM

## 2014-10-07 DIAGNOSIS — Z01810 Encounter for preprocedural cardiovascular examination: Secondary | ICD-10-CM

## 2014-10-25 ENCOUNTER — Encounter: Payer: Self-pay | Admitting: Cardiology

## 2014-10-25 ENCOUNTER — Encounter: Payer: Self-pay | Admitting: *Deleted

## 2014-10-25 ENCOUNTER — Ambulatory Visit (INDEPENDENT_AMBULATORY_CARE_PROVIDER_SITE_OTHER): Payer: Medicare Other | Admitting: Cardiology

## 2014-10-25 VITALS — BP 151/70 | HR 59 | Ht 63.0 in | Wt 158.0 lb

## 2014-10-25 DIAGNOSIS — I2510 Atherosclerotic heart disease of native coronary artery without angina pectoris: Secondary | ICD-10-CM | POA: Diagnosis not present

## 2014-10-25 DIAGNOSIS — I5032 Chronic diastolic (congestive) heart failure: Secondary | ICD-10-CM

## 2014-10-25 DIAGNOSIS — Z23 Encounter for immunization: Secondary | ICD-10-CM | POA: Diagnosis not present

## 2014-10-25 NOTE — Progress Notes (Signed)
Cardiology Office Note  Date: 10/25/2014   ID: [name], DOB 1945-05-26, MRN [id number]  PCP: Octavio Graves, DO  Primary Cardiologist: Rozann Lesches, MD   Chief Complaint  Patient presents with  . Coronary Artery Disease  . Diastolic heart failure    History of Present Illness: [name] is a 69 y.o. female last seen in July. She is here today with her husband for a follow-up visit. Since I last saw her, weight is down further on oral diuretic regimen. Unfortunately, she has had declining renal function, continues to follow with Dr. Lowanda Foster, and plans are to proceed with vascular access placement anticipating hemodialysis likely within the next 6 months. I am requesting most recent lab work for review.  She has not had any angina symptoms however on current medical regimen. Diuretics have been cut back somewhat, medications reviewed below.  Most recent echocardiogram from last year is outlined below.   Past Medical History  Diagnosis Date  . Fibromyalgia   . Hypothyroidism   . Diabetes mellitus, type 2 ([place])   . Coronary atherosclerosis of native coronary artery     BMS circ/RCA 2001, BMS RCA 2003, PTCA RCA/ramus 2004, DES RCA 2005, DES RCA 2007, DES OM/LAD 2009, DES RCA 7/11, LVEF 65%  . Hyperlipidemia   . Essential hypertension, benign   . MI (myocardial infarction) ([place])   . Melanoma [facility])     Current Outpatient Prescriptions  Medication Sig Dispense Refill  . amitriptyline (ELAVIL) 10 MG tablet Take 30 mg by mouth at bedtime. Takes in addition to the 100mg  at bedtime.    Marland Kitchen amitriptyline (ELAVIL) 100 MG tablet Take 100 mg by mouth at bedtime.      Marland Kitchen amLODipine (NORVASC) 10 MG tablet Take 10 mg by mouth daily.      Marland Kitchen aspirin 81 MG tablet Take 81 mg by mouth at bedtime.     . carvedilol (COREG) 25 MG tablet Take 25 mg by mouth 2 (two) times daily.      . Cholecalciferol (VITAMIN D) 2000 UNITS CAPS Take 1 capsule by mouth every morning.     .  cloNIDine (CATAPRES) 0.3 MG tablet Take 0.3 mg by mouth 2 (two) times daily.      . clopidogrel (PLAVIX) 75 MG tablet Take 75 mg by mouth every morning.     Marland Kitchen co-enzyme Q-10 30 MG capsule Take 30 mg by mouth daily.    . hydrALAZINE (APRESOLINE) 25 MG tablet Take 25 mg by mouth 3 (three) times daily.    . insulin glargine (LANTUS) 100 UNIT/ML injection Inject 0.15 mLs (15 Units total) into the skin at bedtime. *Based on blood sugar levels (Patient taking differently: Inject into the skin at bedtime. *Based on blood sugar levels - sliding scale) 10 mL 11  . insulin glulisine (APIDRA) 100 UNIT/ML injection Inject 5 Units into the skin 2 (two) times daily after a meal. After breakfast & lunch.    . isosorbide mononitrate (IMDUR) 60 MG 24 hr tablet TAKE 60MG  IN THE AM AND 30MG  IN THE PM (TAKE HALF TABLET IF SCORED) (Patient taking differently: Take 30-60 mg by mouth 2 (two) times daily. TAKE 60MG  IN THE AM AND 30MG  IN THE PM (TAKE HALF TABLET IF SCORED)) 135 tablet 6  . levothyroxine (SYNTHROID, LEVOTHROID) 150 MCG tablet Take 150 mcg by mouth daily before breakfast.    . metolazone (ZAROXOLYN) 5 MG tablet Take 2.5 mg by mouth daily. As needed.    Marland Kitchen  nitroGLYCERIN (NITROSTAT) 0.4 MG SL tablet Place 1 tablet (0.4 mg total) under the tongue every 5 (five) minutes x 3 doses as needed. 25 tablet 3  . Omega-3 Fatty Acids (FISH OIL) 1000 MG CAPS Take 2-3 capsules by mouth 2 (two) times daily. Take 3 in am and 2 at bedtime    . polyethylene glycol powder (MIRALAX) powder Take 17 g by mouth daily as needed for mild constipation. In 8 oz of water or juice    . torsemide (DEMADEX) 10 MG tablet Take 5 tablets (50 mg total) by mouth daily. (Patient taking differently: Take 50 mg by mouth 2 (two) times daily. )    . valsartan (DIOVAN) 320 MG tablet Take 320 mg by mouth every morning.     . vitamin C (ASCORBIC ACID) 500 MG tablet Take 500 mg by mouth daily.      . vitamin E 400 UNIT capsule Take 400 Units by mouth daily.        No current facility-administered medications for this visit.    Allergies:  Sulfonamide derivatives   Social History: The patient  reports that she has never smoked. She has never used smokeless tobacco. She reports that she does not drink alcohol or use illicit drugs.   ROS:  Please see the history of present illness. Otherwise, complete review of systems is positive for fatigue, limited appetite.  All other systems are reviewed and negative.   Physical Exam: VS:  BP 151/70 mmHg  Pulse 59  Ht 5\' 3"  (1.6 m)  Wt 158 lb (71.668 kg)  BMI 28.00 kg/m2  SpO2 96%, BMI Body mass index is 28 kg/(m^2).  Wt Readings from Last 3 Encounters:  10/25/14 158 lb (71.668 kg)  07/18/14 170 lb 12.8 oz (77.474 kg)  06/23/14 171 lb 6.4 oz (77.747 kg)     Overweight woman, appears comfortable at rest.  HEENT: Conjunctiva and lids normal, oropharynx clear.  Neck: Supple, no elevated JVP or carotid bruits, no thyromegaly.  Lungs: Clear to auscultation, nonlabored breathing at rest.  Cardiac: Regular rate and rhythm, no S3, 2/6 basal systolic murmur, no pericardial rub.  Abdomen: Soft, protuberant, nontender, bowel sounds present, no guarding or rebound.  Extremities: 2+ edema, distal pulses 1+.  Skin: Warm and dry. Musculoskeletal: No kyphosis. Neuropsychiatric: Alert and oriented 3, affect appropriate.   ECG: ECG is not ordered today.   Recent Labwork: 06/20/2014: B Natriuretic Peptide 267.0*; TSH 1.461 06/21/2014: ALT 21; AST 18 06/23/2014: BUN 49*; Creatinine, Ser 2.76*; Hemoglobin 10.3*; Platelets 253; Potassium 3.2*; Sodium 140   Other Studies Reviewed Today:  Echocardiogram from August 2015 reported moderate to severe LVH with LVEF 60-65%, increased left atrial pressure, sclerotic aortic valve without stenosis, mild mitral regurgitation.  Assessment and Plan:  1. CAD status post multiple PCI's over time, most recently DES to the RCA in 2011. Fortunately, she has been stable  without active angina symptoms on medical therapy. LVEF 60-65% by most recent testing. Plan is to continue medical therapy and observation.  2. Chronic diastolic heart failure, complicated by progressing renal insufficiency. Volume status has been relatively stable on current diuretic regimen.  3. Progressing renal insufficiency, presumedly stage 4-5, labwork pending for review. She is following with Dr. Lowanda Foster with plan to proceed with vascular access.  Current medicines were reviewed with the patient today.   Orders Placed This Encounter  Procedures  . Flu Vaccine QUAD 36+ mos IM    Disposition: FU with me in 3 months.   Signed,  Satira Sark, MD, [facility] 10/25/2014 4:19 PM    [facility] [place], [place], [place] [address] Phone: [phone]; Fax: [phone]

## 2014-10-25 NOTE — Patient Instructions (Signed)
Your physician recommends that you continue on your current medications as directed. Please refer to the Current Medication list given to you today.  Your physician recommends that you schedule a follow-up appointment in: 3 months  

## 2014-11-10 ENCOUNTER — Encounter: Payer: Self-pay | Admitting: Surgery

## 2014-11-14 ENCOUNTER — Ambulatory Visit (HOSPITAL_COMMUNITY)
Admission: RE | Admit: 2014-11-14 | Discharge: 2014-11-14 | Disposition: A | Discharge status: Home / Self Care | Payer: Medicare Other | Source: Ambulatory Visit | Attending: Surgery | Admitting: Surgery

## 2014-11-14 ENCOUNTER — Encounter: Payer: Self-pay | Admitting: Surgery

## 2014-11-14 ENCOUNTER — Ambulatory Visit (INDEPENDENT_AMBULATORY_CARE_PROVIDER_SITE_OTHER): Payer: Medicare Other | Admitting: Surgery

## 2014-11-14 ENCOUNTER — Other Ambulatory Visit: Payer: Self-pay

## 2014-11-14 ENCOUNTER — Ambulatory Visit (INDEPENDENT_AMBULATORY_CARE_PROVIDER_SITE_OTHER)
Admission: RE | Admit: 2014-11-14 | Discharge: 2014-11-14 | Disposition: A | Discharge status: Home / Self Care | Payer: Medicare Other | Source: Ambulatory Visit | Attending: Surgery | Admitting: Surgery

## 2014-11-14 VITALS — BP 169/52 | HR 48 | Temp 97.7°F | Resp 14 | Ht 62.0 in | Wt 165.0 lb

## 2014-11-14 DIAGNOSIS — N184 Chronic kidney disease, stage 4 (severe): Secondary | ICD-10-CM | POA: Diagnosis not present

## 2014-11-14 DIAGNOSIS — I2510 Atherosclerotic heart disease of native coronary artery without angina pectoris: Secondary | ICD-10-CM

## 2014-11-14 DIAGNOSIS — Z01810 Encounter for preprocedural cardiovascular examination: Secondary | ICD-10-CM | POA: Diagnosis not present

## 2014-11-14 NOTE — Progress Notes (Signed)
L   History of Present Illness:  Patient is a 69 y.o. year old female who presents for placement of a permanent hemodialysis access. The patient is right handed .  The patient is not currently on hemodialysis.  The cause of renal failure is thought to be secondary to multiple cardiac procedures requiring contrast dye and chronic DM.   Other chronic medical problems include hypertension managed with a betablocker, dyslipidemia not currently on a statin, DM managed with insulin and CAD s/p 13 cardiac stents on Plavix and Asprin.  Her Nephrologist Dr. Delrae Sawyers states she will likely;y need HD after the first of the year.  Past Medical History  Diagnosis Date  . Fibromyalgia   . Hypothyroidism   . Diabetes mellitus, type 2 ([place])   . Coronary atherosclerosis of native coronary artery     BMS circ/RCA 2001, BMS RCA 2003, PTCA RCA/ramus 2004, DES RCA 2005, DES RCA 2007, DES OM/LAD 2009, DES RCA 7/11, LVEF 65%  . Hyperlipidemia   . Essential hypertension, benign   . MI (myocardial infarction) ([place])   . Melanoma ([place])   . CHF (congestive heart failure) ([place])   . Chronic kidney disease   . Peripheral arterial disease [facility])     Past Surgical History  Procedure Laterality Date  . Cholecystectomy    . Total abdominal hysterectomy    . Bladder suspension    . Neuroplasty / transposition median nerve at carpal tunnel bilateral    . Coronary stent placement    . Tumor removal       Social History Social History  Substance Use Topics  . Smoking status: Never Smoker   . Smokeless tobacco: Never Used     Comment: tobacco use - no  . Alcohol Use: No    Family History Family History  Problem Relation Age of Onset  . Coronary artery disease    . Cancer Mother   . Heart attack Mother   . Aneurysm Mother     Brain  . Cancer Father   . Diabetes Father   . Heart disease Father     Before age 35-- AAA  . Hypertension Father   . Heart attack Father   . Diabetes Sister   . Diabetes  Brother     Allergies  Allergies  Allergen Reactions  . Pregabalin Other (See Comments)    Lyrica- Shakes.     . Sulfonamide Derivatives Swelling    REACTION: throat swelling     Current Outpatient Prescriptions  Medication Sig Dispense Refill  . amitriptyline (ELAVIL) 10 MG tablet Take 30 mg by mouth at bedtime. Takes in addition to the 100mg  at bedtime.    Marland Kitchen amitriptyline (ELAVIL) 100 MG tablet Take 100 mg by mouth at bedtime.      Marland Kitchen amLODipine (NORVASC) 10 MG tablet Take 10 mg by mouth daily.      Marland Kitchen aspirin 81 MG tablet Take 81 mg by mouth at bedtime.     . carvedilol (COREG) 25 MG tablet Take 25 mg by mouth 2 (two) times daily.      . Cholecalciferol (VITAMIN D) 2000 UNITS CAPS Take 1 capsule by mouth every morning.     . cloNIDine (CATAPRES) 0.3 MG tablet Take 0.3 mg by mouth 2 (two) times daily.      . clopidogrel (PLAVIX) 75 MG tablet Take 75 mg by mouth every morning.     Marland Kitchen co-enzyme Q-10 30 MG capsule Take 30 mg by mouth daily.    Marland Kitchen  hydrALAZINE (APRESOLINE) 25 MG tablet Take 25 mg by mouth 3 (three) times daily.    . insulin glargine (LANTUS) 100 UNIT/ML injection Inject 0.15 mLs (15 Units total) into the skin at bedtime. *Based on blood sugar levels (Patient taking differently: Inject into the skin at bedtime. *Based on blood sugar levels - sliding scale) 10 mL 11  . insulin glulisine (APIDRA) 100 UNIT/ML injection Inject 5 Units into the skin 2 (two) times daily after a meal. After breakfast & lunch.    . isosorbide mononitrate (IMDUR) 60 MG 24 hr tablet TAKE 60MG  IN THE AM AND 30MG  IN THE PM (TAKE HALF TABLET IF SCORED) (Patient taking differently: Take 30-60 mg by mouth 2 (two) times daily. TAKE 60MG  IN THE AM AND 30MG  IN THE PM (TAKE HALF TABLET IF SCORED)) 135 tablet 6  . levothyroxine (SYNTHROID, LEVOTHROID) 150 MCG tablet Take 150 mcg by mouth daily before breakfast.    . metolazone (ZAROXOLYN) 5 MG tablet Take 2.5 mg by mouth daily. As needed.    . nitroGLYCERIN  (NITROSTAT) 0.4 MG SL tablet Place 1 tablet (0.4 mg total) under the tongue every 5 (five) minutes x 3 doses as needed. 25 tablet 3  . Omega-3 Fatty Acids (FISH OIL) 1000 MG CAPS Take 2-3 capsules by mouth 2 (two) times daily. Take 3 in am and 2 at bedtime    . polyethylene glycol powder (MIRALAX) powder Take 17 g by mouth daily as needed for mild constipation. In 8 oz of water or juice    . torsemide (DEMADEX) 10 MG tablet Take 5 tablets (50 mg total) by mouth daily.    . valsartan (DIOVAN) 320 MG tablet Take 320 mg by mouth every morning.     . vitamin C (ASCORBIC ACID) 500 MG tablet Take 500 mg by mouth daily.      . vitamin E 400 UNIT capsule Take 400 Units by mouth daily.       No current facility-administered medications for this visit.    ROS:   General:  No weight loss, Fever, chills  HEENT: No recent headaches, no nasal bleeding, no visual changes, no sore throat  Neurologic: No dizziness, blackouts, seizures. No recent symptoms of stroke or mini- stroke. No recent episodes of slurred speech, or temporary blindness.  Cardiac: No recent episodes of chest pain/pressure, no shortness of breath at rest.  No shortness of breath with exertion.  Denies history of atrial fibrillation or irregular heartbeat  Vascular: No history of rest pain in feet.  No history of claudication.  No history of non-healing ulcer, No history of DVT   Pulmonary: No home oxygen, no productive cough, no hemoptysis,  No asthma or wheezing  Musculoskeletal:  [x ] Arthritis, [ ]  Low back pain,  [x ] Joint pain  Hematologic:No history of hypercoagulable state.  No history of easy bleeding.  No history of anemia  Gastrointestinal: No hematochezia or melena,  No gastroesophageal reflux, no trouble swallowing  Urinary: [x ] chronic Kidney disease, [ ]  on HD - [ ]  MWF or [ ]  TTHS, [ ]  Burning with urination, [ ]  Frequent urination, [ ]  Difficulty urinating;   Skin: No rashes  Psychological: No history of  anxiety,  No history of depression   Physical Examination  Filed Vitals:   11/14/14 1425 11/14/14 1431  BP: 167/53 169/52  Pulse: 51 48  Temp: 97.7 F (36.5 C)   TempSrc: Oral   Resp: 14   Height: 5\' 2"  (1.575 m)  Weight: 165 lb (74.844 kg)   SpO2: 100%     Body mass index is 30.17 kg/(m^2).  General:  Alert and oriented, no acute distress HEENT: Normal Neck: No bruit or JVD Pulmonary: Clear to auscultation bilaterally Cardiac: Regular  Rhythm, brady  without murmur Gastrointestinal: Soft, non-tender, non-distended, no mass, no scars Skin: No rash Extremity Pulses:  2+ radial, brachial pulses bilaterally Musculoskeletal: No deformity, generalized bilateral LE edema  Neurologic: Upper and lower extremity motor 5/5 and symmetric  DATA:  Acceptable left cephalic vein and very good basilic vein.   ASSESSMENPLAN:T/:  CKD nearing ESRD and the start of HD. We will need to get cardiac clearance prior to creation of the left AV fistula cephalic would be our first chose and then basilic.  The benefits and risks were discussed with Dr. Trula Slade today.   COLLINS, EMMA MAUREEN PA-C [facility]  The patient was seen today in conjunction with Dr. Trula Slade   I agree with the above.  I have seen and evaluated the patient.  This is a right-handed female.  She is not yet on dialysis.  Her most recent GFR was 13%.  She is here for access planning.  After reviewing her vein mapping I feel that she would most likely be a candidate for a left brachiocephalic fistula.  We discussed evaluating the cephalic vein in the forearm and consider a radiocephalic fistula.  The vein looks better than it did on vein mapping.  We also discussed that she may require a basilic vein transposition if the cephalic vein does not appear adequate.  She knows this may be a 1 or 2 staged operation.  I also discussed the risk of non-maturity and steal.  She will get this scheduled at  the earliest available appointment.  The patient has an extensive cardiac history.  He will need formal cardiac clearance.  I will also try to stop her Plavix 5 days prior.  Annamarie Major

## 2014-11-14 NOTE — Progress Notes (Signed)
Filed Vitals:   11/14/14 1425 11/14/14 1431  BP: 167/53 169/52  Pulse: 51 48  Temp: 97.7 F (36.5 C)   TempSrc: Oral   Resp: 14   Height: 5\' 2"  (1.575 m)   Weight: 165 lb (74.844 kg)   SpO2: 100%

## 2014-11-16 ENCOUNTER — Encounter: Payer: Self-pay | Admitting: Nephrology

## 2014-11-17 ENCOUNTER — Encounter: Payer: Self-pay | Admitting: Surgery

## 2014-11-18 ENCOUNTER — Encounter (HOSPITAL_COMMUNITY): Payer: Self-pay | Admitting: *Deleted

## 2014-11-18 NOTE — Progress Notes (Signed)
Pt has extensive cardiac history, has cardiac clearance from Dr. Domenic Polite, copy in chart. Pt's husband states that pt has not c/o of any chest pain or sob recently. Her last dose of Plavix was 11/15/14.  Pt's last A1C was earlier this month and was 5.1 per Mr. Hesson. He states her fasting blood sugars run between 100-140.  Pt has chronic back pain and will need a small pillow under small of her back if she needs to lay flat.

## 2014-11-18 NOTE — Progress Notes (Signed)
Anesthesia Chart Review: SAME DAY WORK-UP.  Patient is a 69 year old female scheduled for left brachiocephalic AVF versus basilic vein transposition on 11/21/14 by Dr. Donnetta Hutching.  History includes never smoker, post-op N/V, fibromyalgia, hypothyroidism, DM2, CAD/MI s/p BMS CX and RCA '01 and BMS RCA '03 and PTCA RCA/ramus '04 and DES RCA '05 and '07 and DES OM and LAD '09 and DES RCA '11, CHF, HTN, CKD stage IV, melanoma, PAD, HLD, hysterectomy, cholecystectomy. Hospitalized for CAP 06/2014. Chest CT follow-up recommended due to lung nodules with history of melanoma.  PCP is Dr. Octavio Graves. Nephrologist is Dr. Hinda Lenis. Cardiologist is Dr. Rozann Lesches who cleared her at moderate CV risk.   Meds include Elavil, amlodipine, Coreg, ASA, Catapres, Plavix, glyburide, hydralazine, Lantus, Imdur, levothyroxine, Zaroxlyn, Relafen, Nitro, fish oil, torsemide, valsartan. She was instructed to hold Plavix for 5 days pre-op.  06/20/14 EKG: SB at 60 bpm, borderline low voltage in limb leads, non-specific T wave abnormalities in lateral leads.   08/26/13 Echo: Study Conclusions - Left ventricle: The cavity size was normal. Wall thickness was increased in a pattern of moderate to severe LVH. Systolic function was normal. The estimated ejection fraction was in the range of 60% to 65%. Abnormal diastolic function, indeterminate grade. There is evidence of elevated LA pressure E/e&' 12. - Aortic valve: Mildly calcified annulus. Trileaflet; mildly thickened leaflets. Valve area (VTI): 2.29 cm^2. Valve area (Vmax): 2.23 cm^2. Valve area (Vmean): 2.85 cm^2. - Mitral valve: Mildly calcified annulus. Mildly thickened leaflets. There was mild regurgitation. - Technically difficult study.  06/11/10 [facility] (Dr. Dian Queen): IMPRESSION: 1. Triple-vessel coronary artery disease with patent stents in the left anterior descending artery, circumflex artery, and right coronary artery. 2. Moderate (60%) stenosis in the proximal left  anterior descending artery. RECOMMENDATIONS: At this time, I would continue diuresis and will continue medical management for coronary artery disease. If the patient has continued complaints of exertional chest discomfort, we could consider performing a stress Myoview to see if there is any ischemia in the anterior wall and the distribution of her left anterior descending artery.  06/19/13 Chest CT w/o contrast: IMPRESSION: Minimal bilateral pleural effusions with left greater than right. Multiple nodular and airspace opacities are noted throughout both lung which may represent multifocal pneumonia, but metastatic disease cannot be excluded given the history of melanoma. Follow-up CT scan in 2-3 weeks is recommended to ensure resolution of these abnormalities. If these abnormalities persist on followup CT scan, this would be suspicious for metastatic disease. (Per 07/18/14 cardiology note, patient was following up with Dr. Melina Copa for arrangement of follow-up CT.)  She will get labs on arrival.   Moderate CV risk per cardiology. Further evaluation on the day of surgery to ensure no acute changes and that labs are acceptable for OR.  George Hugh [facility] Short Stay Center/Anesthesiology Phone [phone] 11/18/2014 10:50 AM

## 2014-11-21 ENCOUNTER — Encounter (HOSPITAL_COMMUNITY): Payer: Self-pay | Admitting: *Deleted

## 2014-11-21 ENCOUNTER — Encounter (HOSPITAL_COMMUNITY)
Admission: RE | Disposition: A | Discharge status: Home / Self Care | Payer: Self-pay | Source: Ambulatory Visit | Attending: Vascular Surgery

## 2014-11-21 ENCOUNTER — Other Ambulatory Visit: Payer: Self-pay | Admitting: *Deleted

## 2014-11-21 ENCOUNTER — Ambulatory Visit (HOSPITAL_COMMUNITY): Payer: Medicare Other | Admitting: Vascular Surgery

## 2014-11-21 ENCOUNTER — Ambulatory Visit (HOSPITAL_COMMUNITY)
Admission: RE | Admit: 2014-11-21 | Discharge: 2014-11-21 | Disposition: A | Discharge status: Home / Self Care | Payer: Medicare Other | Source: Ambulatory Visit | Attending: Vascular Surgery | Admitting: Vascular Surgery

## 2014-11-21 DIAGNOSIS — I509 Heart failure, unspecified: Secondary | ICD-10-CM | POA: Diagnosis not present

## 2014-11-21 DIAGNOSIS — Z794 Long term (current) use of insulin: Secondary | ICD-10-CM | POA: Diagnosis not present

## 2014-11-21 DIAGNOSIS — N184 Chronic kidney disease, stage 4 (severe): Secondary | ICD-10-CM | POA: Diagnosis not present

## 2014-11-21 DIAGNOSIS — Z85820 Personal history of malignant melanoma of skin: Secondary | ICD-10-CM | POA: Diagnosis not present

## 2014-11-21 DIAGNOSIS — Z7982 Long term (current) use of aspirin: Secondary | ICD-10-CM | POA: Insufficient documentation

## 2014-11-21 DIAGNOSIS — E785 Hyperlipidemia, unspecified: Secondary | ICD-10-CM | POA: Insufficient documentation

## 2014-11-21 DIAGNOSIS — M797 Fibromyalgia: Secondary | ICD-10-CM | POA: Insufficient documentation

## 2014-11-21 DIAGNOSIS — Z4931 Encounter for adequacy testing for hemodialysis: Secondary | ICD-10-CM

## 2014-11-21 DIAGNOSIS — Z7902 Long term (current) use of antithrombotics/antiplatelets: Secondary | ICD-10-CM | POA: Diagnosis not present

## 2014-11-21 DIAGNOSIS — E1151 Type 2 diabetes mellitus with diabetic peripheral angiopathy without gangrene: Secondary | ICD-10-CM | POA: Diagnosis not present

## 2014-11-21 DIAGNOSIS — E039 Hypothyroidism, unspecified: Secondary | ICD-10-CM | POA: Diagnosis not present

## 2014-11-21 DIAGNOSIS — I252 Old myocardial infarction: Secondary | ICD-10-CM | POA: Insufficient documentation

## 2014-11-21 DIAGNOSIS — E1122 Type 2 diabetes mellitus with diabetic chronic kidney disease: Secondary | ICD-10-CM | POA: Diagnosis not present

## 2014-11-21 DIAGNOSIS — I2510 Atherosclerotic heart disease of native coronary artery without angina pectoris: Secondary | ICD-10-CM | POA: Diagnosis not present

## 2014-11-21 DIAGNOSIS — N185 Chronic kidney disease, stage 5: Secondary | ICD-10-CM | POA: Diagnosis not present

## 2014-11-21 DIAGNOSIS — N189 Chronic kidney disease, unspecified: Secondary | ICD-10-CM | POA: Diagnosis present

## 2014-11-21 DIAGNOSIS — Z955 Presence of coronary angioplasty implant and graft: Secondary | ICD-10-CM | POA: Diagnosis not present

## 2014-11-21 DIAGNOSIS — Z79899 Other long term (current) drug therapy: Secondary | ICD-10-CM | POA: Diagnosis not present

## 2014-11-21 DIAGNOSIS — I130 Hypertensive heart and chronic kidney disease with heart failure and stage 1 through stage 4 chronic kidney disease, or unspecified chronic kidney disease: Secondary | ICD-10-CM | POA: Insufficient documentation

## 2014-11-21 DIAGNOSIS — N186 End stage renal disease: Secondary | ICD-10-CM

## 2014-11-21 HISTORY — DX: Dorsalgia, unspecified: M54.9

## 2014-11-21 HISTORY — DX: Retinal hemorrhage, bilateral: H35.63

## 2014-11-21 HISTORY — PX: AV FISTULA PLACEMENT: SHX1204

## 2014-11-21 LAB — POCT I-STAT 4, (NA,K, GLUC, HGB,HCT)
Glucose, Bld: 272 mg/dL — ABNORMAL HIGH (ref 65–99)
HEMATOCRIT: 30 % — AB (ref 36.0–46.0)
HEMOGLOBIN: 10.2 g/dL — AB (ref 12.0–15.0)
POTASSIUM: 3.2 mmol/L — AB (ref 3.5–5.1)
SODIUM: 140 mmol/L (ref 135–145)

## 2014-11-21 LAB — GLUCOSE, CAPILLARY
GLUCOSE-CAPILLARY: 219 mg/dL — AB (ref 65–99)
Glucose-Capillary: 257 mg/dL — ABNORMAL HIGH (ref 65–99)
Glucose-Capillary: 235 mg/dL — ABNORMAL HIGH (ref 65–99)

## 2014-11-21 SURGERY — ARTERIOVENOUS (AV) FISTULA CREATION
Anesthesia: Monitor Anesthesia Care | Site: Arm Lower | Laterality: Left

## 2014-11-21 MED ORDER — LIDOCAINE-EPINEPHRINE 0.5 %-1:200000 IJ SOLN
INTRAMUSCULAR | Status: DC | PRN
  Administered 2014-11-21: 8 mL

## 2014-11-21 MED ORDER — 0.9 % SODIUM CHLORIDE (POUR BTL) OPTIME
TOPICAL | Status: DC | PRN
  Administered 2014-11-21: 1000 mL

## 2014-11-21 MED ORDER — SODIUM CHLORIDE 0.9 % IV SOLN
INTRAVENOUS | Status: DC
  Administered 2014-11-21: 09:00:00 via INTRAVENOUS

## 2014-11-21 MED ORDER — MIDAZOLAM HCL 2 MG/2ML IJ SOLN
INTRAMUSCULAR | Status: AC
  Filled 2014-11-21: qty 4

## 2014-11-21 MED ORDER — PROPOFOL 10 MG/ML IV BOLUS
INTRAVENOUS | Status: AC
  Filled 2014-11-21: qty 20

## 2014-11-21 MED ORDER — FENTANYL CITRATE (PF) 250 MCG/5ML IJ SOLN
INTRAMUSCULAR | Status: AC
  Filled 2014-11-21: qty 5

## 2014-11-21 MED ORDER — ONDANSETRON HCL 4 MG/2ML IJ SOLN
INTRAMUSCULAR | Status: DC | PRN
  Administered 2014-11-21: 4 mg via INTRAVENOUS

## 2014-11-21 MED ORDER — PROPOFOL 500 MG/50ML IV EMUL
INTRAVENOUS | Status: DC | PRN
  Administered 2014-11-21: 50 ug/kg/min via INTRAVENOUS

## 2014-11-21 MED ORDER — ROCURONIUM BROMIDE 50 MG/5ML IV SOLN
INTRAVENOUS | Status: AC
  Filled 2014-11-21: qty 1

## 2014-11-21 MED ORDER — LIDOCAINE HCL (CARDIAC) 20 MG/ML IV SOLN
INTRAVENOUS | Status: DC | PRN
  Administered 2014-11-21: 40 mg via INTRAVENOUS

## 2014-11-21 MED ORDER — CHLORHEXIDINE GLUCONATE CLOTH 2 % EX PADS: 6.0000 | MEDICATED_PAD | Freq: Once | CUTANEOUS | Status: DC

## 2014-11-21 MED ORDER — ONDANSETRON HCL 4 MG/2ML IJ SOLN
INTRAMUSCULAR | Status: AC
  Filled 2014-11-21: qty 2

## 2014-11-21 MED ORDER — HEPARIN SODIUM (PORCINE) 1000 UNIT/ML IJ SOLN
INTRAMUSCULAR | Status: AC
  Filled 2014-11-21: qty 1

## 2014-11-21 MED ORDER — LACTATED RINGERS IV SOLN: INTRAVENOUS | Status: DC

## 2014-11-21 MED ORDER — FENTANYL CITRATE (PF) 100 MCG/2ML IJ SOLN
25.0000 ug | INTRAMUSCULAR | Status: DC | PRN
Start: 2014-11-21 — End: 2014-11-21

## 2014-11-21 MED ORDER — SODIUM CHLORIDE 0.9 % IV SOLN
INTRAVENOUS | Status: DC | PRN
  Administered 2014-11-21: 500 mL

## 2014-11-21 MED ORDER — DEXTROSE 5 % IV SOLN
1.5000 g | INTRAVENOUS | Status: AC
  Administered 2014-11-21: 1.5 g via INTRAVENOUS

## 2014-11-21 MED ORDER — PROMETHAZINE HCL 25 MG/ML IJ SOLN: 6.2500 mg | INTRAMUSCULAR | Status: DC | PRN

## 2014-11-21 MED ORDER — CEFUROXIME SODIUM 1.5 G IJ SOLR
INTRAMUSCULAR | Status: AC
  Filled 2014-11-21: qty 1.5

## 2014-11-21 MED ORDER — LIDOCAINE HCL (CARDIAC) 20 MG/ML IV SOLN
INTRAVENOUS | Status: AC
  Filled 2014-11-21: qty 5

## 2014-11-21 MED ORDER — MEPERIDINE HCL 25 MG/ML IJ SOLN: 6.2500 mg | INTRAMUSCULAR | Status: DC | PRN

## 2014-11-21 MED ORDER — LIDOCAINE-EPINEPHRINE 0.5 %-1:200000 IJ SOLN
INTRAMUSCULAR | Status: AC
  Filled 2014-11-21: qty 1

## 2014-11-21 MED ORDER — FENTANYL CITRATE (PF) 100 MCG/2ML IJ SOLN
INTRAMUSCULAR | Status: DC | PRN
  Administered 2014-11-21 (×2): 25 ug via INTRAVENOUS

## 2014-11-21 MED ORDER — PROTAMINE SULFATE 10 MG/ML IV SOLN
INTRAVENOUS | Status: AC
  Filled 2014-11-21: qty 5

## 2014-11-21 SURGICAL SUPPLY — 38 items
APL SKNCLS STERI-STRIP NONHPOA (GAUZE/BANDAGES/DRESSINGS) ×1
ARMBAND PINK RESTRICT EXTREMIT (MISCELLANEOUS) ×3 IMPLANT
BENZOIN TINCTURE PRP APPL 2/3 (GAUZE/BANDAGES/DRESSINGS) ×3 IMPLANT
CANISTER SUCTION 2500CC (MISCELLANEOUS) ×3 IMPLANT
CANNULA VESSEL 3MM 2 BLNT TIP (CANNULA) ×2 IMPLANT
CLIP LIGATING EXTRA MED SLVR (CLIP) ×3 IMPLANT
CLIP LIGATING EXTRA SM BLUE (MISCELLANEOUS) ×3 IMPLANT
CLOSURE WOUND 1/2 X4 (GAUZE/BANDAGES/DRESSINGS) ×1
COVER PROBE W GEL 5X96 (DRAPES) ×3 IMPLANT
DECANTER SPIKE VIAL GLASS SM (MISCELLANEOUS) ×3 IMPLANT
ELECT REM PT RETURN 9FT ADLT (ELECTROSURGICAL) ×3
ELECTRODE REM PT RTRN 9FT ADLT (ELECTROSURGICAL) ×1 IMPLANT
GAUZE SPONGE 4X4 12PLY STRL (GAUZE/BANDAGES/DRESSINGS) ×3 IMPLANT
GEL ULTRASOUND 20GR AQUASONIC (MISCELLANEOUS) IMPLANT
GLOVE BIOGEL PI IND STRL 6.5 (GLOVE) IMPLANT
GLOVE BIOGEL PI INDICATOR 6.5 (GLOVE) ×4
GLOVE SS BIOGEL STRL SZ 6.5 (GLOVE) IMPLANT
GLOVE SS BIOGEL STRL SZ 7.5 (GLOVE) ×1 IMPLANT
GLOVE SUPERSENSE BIOGEL SZ 6.5 (GLOVE) ×2
GLOVE SUPERSENSE BIOGEL SZ 7.5 (GLOVE) ×2
GLOVE SURG SS PI 7.0 STRL IVOR (GLOVE) ×2 IMPLANT
GOWN STRL REUS W/ TWL LRG LVL3 (GOWN DISPOSABLE) ×3 IMPLANT
GOWN STRL REUS W/ TWL XL LVL3 (GOWN DISPOSABLE) IMPLANT
GOWN STRL REUS W/TWL LRG LVL3 (GOWN DISPOSABLE) ×6
GOWN STRL REUS W/TWL XL LVL3 (GOWN DISPOSABLE) ×3
KIT BASIN OR (CUSTOM PROCEDURE TRAY) ×3 IMPLANT
KIT ROOM TURNOVER OR (KITS) ×3 IMPLANT
NS IRRIG 1000ML POUR BTL (IV SOLUTION) ×3 IMPLANT
PACK CV ACCESS (CUSTOM PROCEDURE TRAY) ×3 IMPLANT
PAD ARMBOARD 7.5X6 YLW CONV (MISCELLANEOUS) ×6 IMPLANT
SPONGE GAUZE 4X4 12PLY STER LF (GAUZE/BANDAGES/DRESSINGS) ×2 IMPLANT
STRIP CLOSURE SKIN 1/2X4 (GAUZE/BANDAGES/DRESSINGS) ×2 IMPLANT
SUT PROLENE 6 0 CC (SUTURE) ×3 IMPLANT
SUT VIC AB 3-0 SH 27 (SUTURE) ×3
SUT VIC AB 3-0 SH 27X BRD (SUTURE) ×1 IMPLANT
TAPE CLOTH SURG 4X10 WHT LF (GAUZE/BANDAGES/DRESSINGS) ×2 IMPLANT
UNDERPAD 30X30 INCONTINENT (UNDERPADS AND DIAPERS) ×3 IMPLANT
WATER STERILE IRR 1000ML POUR (IV SOLUTION) ×3 IMPLANT

## 2014-11-21 NOTE — Anesthesia Preprocedure Evaluation (Addendum)
Anesthesia Evaluation  Patient identified by MRN, date of birth, ID band Patient awake    History of Anesthesia Complications (+) PONV and history of anesthetic complications  Airway Mallampati: III  TM Distance: >3 FB Neck ROM: Full    Dental  (+) Poor Dentition,    Pulmonary    breath sounds clear to auscultation       Cardiovascular hypertension, + CAD, + Cardiac Stents, + Peripheral Vascular Disease and +CHF  + dysrhythmias  Rhythm:Regular Rate:Normal  BMS circ/RCA 2001, BMS RCA 2003, PTCA RCA/ramus 2004, DES RCA 2005, DES RCA 2007, DES OM/LAD 2009, DES RCA 7/11, LVEF 65%   Neuro/Psych  Neuromuscular disease negative psych ROS   GI/Hepatic   Endo/Other  diabetes, Type 2Hypothyroidism   Renal/GU ESRFRenal disease  negative genitourinary   Musculoskeletal  (+) Fibromyalgia -  Abdominal   Peds negative pediatric ROS (+)  Hematology negative hematology ROS (+)   Anesthesia Other Findings   Reproductive/Obstetrics negative OB ROS                           Lab Results  Component Value Date   WBC 5.3 06/23/2014   HGB 10.2* 11/21/2014   HCT 30.0* 11/21/2014   MCV 96.6 06/23/2014   PLT 253 06/23/2014   Lab Results  Component Value Date   CREATININE 2.76* 06/23/2014   BUN 49* 06/23/2014   NA 140 11/21/2014   K 3.2* 11/21/2014   CL 103 06/23/2014   CO2 27 06/23/2014   Lab Results  Component Value Date   INR 0.94 12/14/2012   INR 0.97 06/11/2010    Echo 2015: Left ventricle: The cavity size was normal. Wall thickness was increased in a pattern of moderate to severe LVH. Systolic function was normal. The estimated ejection fraction was in the range of 60% to 65%. Abnormal diastolic function, indeterminant grade. There is evidence of elevated LA pressure E/e&' 12. - Aortic valve: Mildly calcified annulus. Trileaflet; mildly thickened leaflets. Valve area (VTI): 2.29 cm^2.  Valve area (Vmax): 2.23 cm^2. Valve area (Vmean): 2.85 cm^2. - Mitral valve: Mildly calcified annulus. Mildly thickened leaflets . There was mild regurgitation. - Technically difficult study.   Anesthesia Physical Anesthesia Plan  ASA: III  Anesthesia Plan: MAC   Post-op Pain Management:    Induction: Intravenous  Airway Management Planned: Natural Airway  Additional Equipment:   Intra-op Plan:   Post-operative Plan: Extubation in OR  Informed Consent: I have reviewed the patients History and Physical, chart, labs and discussed the procedure including the risks, benefits and alternatives for the proposed anesthesia with the patient or authorized representative who has indicated his/her understanding and acceptance.   Dental advisory given  Plan Discussed with: CRNA  Anesthesia Plan Comments: (Possible LMA)       Anesthesia Quick Evaluation

## 2014-11-21 NOTE — H&P (View-Only) (Signed)
 L   History of Present Illness:  Patient is a 69 y.o. year old female who presents for placement of a permanent hemodialysis access. The patient is right handed .  The patient is not currently on hemodialysis.  The cause of renal failure is thought to be secondary to multiple cardiac procedures requiring contrast dye and chronic DM.   Other chronic medical problems include hypertension managed with a betablocker, dyslipidemia not currently on a statin, DM managed with insulin and CAD s/p 13 cardiac stents on Plavix and Asprin.  Her Nephrologist Dr. Bevakadu states she will likely;y need HD after the first of the year.  Past Medical History  Diagnosis Date  . Fibromyalgia   . Hypothyroidism   . Diabetes mellitus, type 2 (HCC)   . Coronary atherosclerosis of native coronary artery     BMS circ/RCA 2001, BMS RCA 2003, PTCA RCA/ramus 2004, DES RCA 2005, DES RCA 2007, DES OM/LAD 2009, DES RCA 7/11, LVEF 65%  . Hyperlipidemia   . Essential hypertension, benign   . MI (myocardial infarction) (HCC)   . Melanoma (HCC)   . CHF (congestive heart failure) (HCC)   . Chronic kidney disease   . Peripheral arterial disease (HCC)     Past Surgical History  Procedure Laterality Date  . Cholecystectomy    . Total abdominal hysterectomy    . Bladder suspension    . Neuroplasty / transposition median nerve at carpal tunnel bilateral    . Coronary stent placement    . Tumor removal       Social History Social History  Substance Use Topics  . Smoking status: Never Smoker   . Smokeless tobacco: Never Used     Comment: tobacco use - no  . Alcohol Use: No    Family History Family History  Problem Relation Age of Onset  . Coronary artery disease    . Cancer Mother   . Heart attack Mother   . Aneurysm Mother     Brain  . Cancer Father   . Diabetes Father   . Heart disease Father     Before age 60-- AAA  . Hypertension Father   . Heart attack Father   . Diabetes Sister   . Diabetes  Brother     Allergies  Allergies  Allergen Reactions  . Pregabalin Other (See Comments)    Lyrica- Shakes.     . Sulfonamide Derivatives Swelling    REACTION: throat swelling     Current Outpatient Prescriptions  Medication Sig Dispense Refill  . amitriptyline (ELAVIL) 10 MG tablet Take 30 mg by mouth at bedtime. Takes in addition to the 100mg at bedtime.    . amitriptyline (ELAVIL) 100 MG tablet Take 100 mg by mouth at bedtime.      . amLODipine (NORVASC) 10 MG tablet Take 10 mg by mouth daily.      . aspirin 81 MG tablet Take 81 mg by mouth at bedtime.     . carvedilol (COREG) 25 MG tablet Take 25 mg by mouth 2 (two) times daily.      . Cholecalciferol (VITAMIN D) 2000 UNITS CAPS Take 1 capsule by mouth every morning.     . cloNIDine (CATAPRES) 0.3 MG tablet Take 0.3 mg by mouth 2 (two) times daily.      . clopidogrel (PLAVIX) 75 MG tablet Take 75 mg by mouth every morning.     . co-enzyme Q-10 30 MG capsule Take 30 mg by mouth daily.    .   hydrALAZINE (APRESOLINE) 25 MG tablet Take 25 mg by mouth 3 (three) times daily.    . insulin glargine (LANTUS) 100 UNIT/ML injection Inject 0.15 mLs (15 Units total) into the skin at bedtime. *Based on blood sugar levels (Patient taking differently: Inject into the skin at bedtime. *Based on blood sugar levels - sliding scale) 10 mL 11  . insulin glulisine (APIDRA) 100 UNIT/ML injection Inject 5 Units into the skin 2 (two) times daily after a meal. After breakfast & lunch.    . isosorbide mononitrate (IMDUR) 60 MG 24 hr tablet TAKE 60MG IN THE AM AND 30MG IN THE PM (TAKE HALF TABLET IF SCORED) (Patient taking differently: Take 30-60 mg by mouth 2 (two) times daily. TAKE 60MG IN THE AM AND 30MG IN THE PM (TAKE HALF TABLET IF SCORED)) 135 tablet 6  . levothyroxine (SYNTHROID, LEVOTHROID) 150 MCG tablet Take 150 mcg by mouth daily before breakfast.    . metolazone (ZAROXOLYN) 5 MG tablet Take 2.5 mg by mouth daily. As needed.    . nitroGLYCERIN  (NITROSTAT) 0.4 MG SL tablet Place 1 tablet (0.4 mg total) under the tongue every 5 (five) minutes x 3 doses as needed. 25 tablet 3  . Omega-3 Fatty Acids (FISH OIL) 1000 MG CAPS Take 2-3 capsules by mouth 2 (two) times daily. Take 3 in am and 2 at bedtime    . polyethylene glycol powder (MIRALAX) powder Take 17 g by mouth daily as needed for mild constipation. In 8 oz of water or juice    . torsemide (DEMADEX) 10 MG tablet Take 5 tablets (50 mg total) by mouth daily.    . valsartan (DIOVAN) 320 MG tablet Take 320 mg by mouth every morning.     . vitamin C (ASCORBIC ACID) 500 MG tablet Take 500 mg by mouth daily.      . vitamin E 400 UNIT capsule Take 400 Units by mouth daily.       No current facility-administered medications for this visit.    ROS:   General:  No weight loss, Fever, chills  HEENT: No recent headaches, no nasal bleeding, no visual changes, no sore throat  Neurologic: No dizziness, blackouts, seizures. No recent symptoms of stroke or mini- stroke. No recent episodes of slurred speech, or temporary blindness.  Cardiac: No recent episodes of chest pain/pressure, no shortness of breath at rest.  No shortness of breath with exertion.  Denies history of atrial fibrillation or irregular heartbeat  Vascular: No history of rest pain in feet.  No history of claudication.  No history of non-healing ulcer, No history of DVT   Pulmonary: No home oxygen, no productive cough, no hemoptysis,  No asthma or wheezing  Musculoskeletal:  [x ] Arthritis, [ ] Low back pain,  [x ] Joint pain  Hematologic:No history of hypercoagulable state.  No history of easy bleeding.  No history of anemia  Gastrointestinal: No hematochezia or melena,  No gastroesophageal reflux, no trouble swallowing  Urinary: [x ] chronic Kidney disease, [ ] on HD - [ ] MWF or [ ] TTHS, [ ] Burning with urination, [ ] Frequent urination, [ ] Difficulty urinating;   Skin: No rashes  Psychological: No history of  anxiety,  No history of depression   Physical Examination  Filed Vitals:   11/14/14 1425 11/14/14 1431  BP: 167/53 169/52  Pulse: 51 48  Temp: 97.7 F (36.5 C)   TempSrc: Oral   Resp: 14   Height: 5' 2" (1.575 m)     Weight: 165 lb (74.844 kg)   SpO2: 100%     Body mass index is 30.17 kg/(m^2).  General:  Alert and oriented, no acute distress HEENT: Normal Neck: No bruit or JVD Pulmonary: Clear to auscultation bilaterally Cardiac: Regular  Rhythm, brady  without murmur Gastrointestinal: Soft, non-tender, non-distended, no mass, no scars Skin: No rash Extremity Pulses:  2+ radial, brachial pulses bilaterally Musculoskeletal: No deformity, generalized bilateral LE edema  Neurologic: Upper and lower extremity motor 5/5 and symmetric  DATA:  Acceptable left cephalic vein and very good basilic vein.   ASSESSMENPLAN:T/:  CKD nearing ESRD and the start of HD. We will need to get cardiac clearance prior to creation of the left AV fistula cephalic would be our first chose and then basilic.  The benefits and risks were discussed with Dr. Brabham today.   COLLINS, EMMA MAUREEN PA-C [facility]  The patient was seen today in conjunction with Dr. Brabham   I agree with the above.  I have seen and evaluated the patient.  This is a right-handed female.  She is not yet on dialysis.  Her most recent GFR was 13%.  She is here for access planning.  After reviewing her vein mapping I feel that she would most likely be a candidate for a left brachiocephalic fistula.  We discussed evaluating the cephalic vein in the forearm and consider a radiocephalic fistula.  The vein looks better than it did on vein mapping.  We also discussed that she may require a basilic vein transposition if the cephalic vein does not appear adequate.  She knows this may be a 1 or 2 staged operation.  I also discussed the risk of non-maturity and steal.  She will get this scheduled at  the earliest available appointment.  The patient has an extensive cardiac history.  He will need formal cardiac clearance.  I will also try to stop her Plavix 5 days prior.  Wells Brabham 

## 2014-11-21 NOTE — Transfer of Care (Signed)
Immediate Anesthesia Transfer of Care Note  Patient: [name]  Procedure(s) Performed: Procedure(s): LEFT RADIOCEPHALIC ARTERIOVENOUS (AV) FISTULA CREATION  (Left)  Patient Location: PACU  Anesthesia Type:MAC  Level of Consciousness: awake, oriented and patient cooperative  Airway & Oxygen Therapy: Patient Spontanous Breathing and Patient connected to face mask oxygen  Post-op Assessment: Report given to RN and Post -op Vital signs reviewed and stable  Post vital signs: Reviewed  Last Vitals:  Filed Vitals:   11/21/14 1312  BP:   Pulse:   Temp: 36.7 C  Resp:     Complications: No apparent anesthesia complications

## 2014-11-21 NOTE — Interval H&P Note (Signed)
History and Physical Interval Note:  11/21/2014 11:19 AM  [name]  has presented today for surgery, with the diagnosis of Stage IV Chronic Kidney Disease N18.4  The various methods of treatment have been discussed with the patient and family. After consideration of risks, benefits and other options for treatment, the patient has consented to  Procedure(s): BRACHIOCEPHALIC ARTERIOVENOUS (AV) FISTULA CREATION VERSUS BASILIC VEIN TRANSPOSITION (Left) as a surgical intervention .  The patient's history has been reviewed, patient examined, no change in status, stable for surgery.  I have reviewed the patient's chart and labs.  Questions were answered to the patient's satisfaction.     Curt Jews

## 2014-11-21 NOTE — Op Note (Signed)
    OPERATIVE REPORT  DATE OF SURGERY: 11/21/2014  PATIENT: [name], 69 y.o. female MRN: [id number]  DOB: November 29, 1945  PRE-OPERATIVE DIAGNOSIS: Chronic renal insufficiency  POST-OPERATIVE DIAGNOSIS:  Same  PROCEDURE: Left radiocephalic AV fistula creation  SURGEON:  Curt Jews, M.D.  PHYSICIAN ASSISTANT: Nurse  ANESTHESIA:  Local with sedation  EBL: Minimal ml  Total I/O In: 200 [I.V.:200] Out: 10 [Blood:10]  BLOOD ADMINISTERED: None  DRAINS: None  SPECIMEN: None  COUNTS CORRECT:  YES  PLAN OF CARE: PACU   PATIENT DISPOSITION:  PACU - hemodynamically stable  PROCEDURE DETAILS: The patient was taken to the operating placed supine position where the area of the left arm prepped and draped in a sterile fashion. SonoSite ultrasound was used to visualize the cephalic vein throughout the forearm and also in the upper arm. The cephalic vein was patent down to the wrist with moderate size. A size was larger than the above elbow position. Using local anesthesia solution was made between the level of the cephalic vein and the radial artery at the wrist. The vein branches were divided after ligating these with 304 0 silk ties. The vein was ligated distally and divided. The vein was gently dilated to be of adequate size for fistula attempt. The radial artery was exposed the same incision. Had an excellent pulse with minimal atherosclerotic change. A curved Serafin clamp was used to occlude the radial artery proximally and distally. The artery was opened with an 11 blade and sent longitudinally with Potts scissors. Cephalic vein was cut to appropriate length and was spatulated and sewn end-to-side to the artery with a running 60 proline suture. The usual flushing maneuvers the anastomosis was completed. The clamps removed and good flow was noted through the vein. It pulse the wound was irrigated with saline. Hemostasis tablet cautery. Wounds were closed with 30 Vicryls in the  subcutaneous and subcuticular tissue. Benzoin and Steri-Strips were applied   Curt Jews, M.D. 11/21/2014 1:24 PM

## 2014-11-21 NOTE — Anesthesia Procedure Notes (Signed)
Procedure Name: MAC Date/Time: 11/21/2014 12:01 PM Performed by: Jenne Campus Pre-anesthesia Checklist: Patient identified, Emergency Drugs available, Suction available, Patient being monitored and Timeout performed Patient Re-evaluated:Patient Re-evaluated prior to inductionOxygen Delivery Method: Simple face mask

## 2014-11-21 NOTE — Interval H&P Note (Signed)
History and Physical Interval Note:  11/21/2014 11:22 AM  [name]  has presented today for surgery, with the diagnosis of Stage IV Chronic Kidney Disease N18.4  The various methods of treatment have been discussed with the patient and family. After consideration of risks, benefits and other options for treatment, the patient has consented to  Procedure(s): BRACHIOCEPHALIC ARTERIOVENOUS (AV) FISTULA CREATION VERSUS BASILIC VEIN TRANSPOSITION (Left) as a surgical intervention .  The patient's history has been reviewed, patient examined, no change in status, stable for surgery.  I have reviewed the patient's chart and labs.  Questions were answered to the patient's satisfaction.     Curt Jews

## 2014-11-21 NOTE — Discharge Instructions (Signed)
° ° °  11/21/2014 [name] [id number] [birth year]  Surgeon(s): Rosetta Posner, MD  Procedure(s): LEFT RADIOCEPHALIC ARTERIOVENOUS (AV) FISTULA CREATION   x Do not stick graft for 12 weeks

## 2014-11-21 NOTE — Anesthesia Postprocedure Evaluation (Signed)
  Anesthesia Post-op Note  Patient: [name]  Procedure(s) Performed: Procedure(s): LEFT RADIOCEPHALIC ARTERIOVENOUS (AV) FISTULA CREATION  (Left)  Patient Location: PACU  Anesthesia Type:MAC  Level of Consciousness: awake, alert  and oriented  Airway and Oxygen Therapy: Patient Spontanous Breathing  Post-op Pain: none  Post-op Assessment: Post-op Vital signs reviewed              Post-op Vital Signs: Reviewed  Last Vitals:  Filed Vitals:   11/21/14 1430  BP:   Pulse:   Temp: 36.6 C  Resp:     Complications: No apparent anesthesia complications

## 2014-11-22 ENCOUNTER — Encounter (HOSPITAL_COMMUNITY): Payer: Self-pay | Admitting: Vascular Surgery

## 2014-11-22 ENCOUNTER — Telehealth: Payer: Self-pay | Admitting: Vascular Surgery

## 2014-11-22 NOTE — Telephone Encounter (Signed)
-----   Message from Mena Goes, RN sent at 11/21/2014  1:58 PM EST ----- Regarding: schedule   ----- Message -----    From: Rosetta Posner, MD    Sent: 11/21/2014   1:29 PM      To: [facility] Charge Pool  Left radiocephalic AV fistula assistant nurse I need to see her in the office in 1 month

## 2014-11-22 NOTE — Telephone Encounter (Signed)
LM for pt re appt, dpm °

## 2014-11-23 NOTE — Addendum Note (Signed)
Addendum  created 11/23/14 1058 by Effie Berkshire, MD   Modules edited: Anesthesia Responsible Staff

## 2014-12-15 ENCOUNTER — Encounter (HOSPITAL_COMMUNITY): Payer: Medicare Other

## 2014-12-16 ENCOUNTER — Encounter: Payer: Self-pay | Admitting: Vascular Surgery

## 2014-12-20 ENCOUNTER — Encounter: Payer: Medicare Other | Admitting: Vascular Surgery

## 2014-12-20 ENCOUNTER — Ambulatory Visit (INDEPENDENT_AMBULATORY_CARE_PROVIDER_SITE_OTHER): Payer: Medicare Other | Admitting: Vascular Surgery

## 2014-12-20 ENCOUNTER — Encounter: Payer: Self-pay | Admitting: Vascular Surgery

## 2014-12-20 ENCOUNTER — Ambulatory Visit (HOSPITAL_COMMUNITY)
Admission: RE | Admit: 2014-12-20 | Discharge: 2014-12-20 | Disposition: A | Discharge status: Home / Self Care | Payer: Medicare Other | Source: Ambulatory Visit | Attending: Vascular Surgery | Admitting: Vascular Surgery

## 2014-12-20 VITALS — BP 117/66 | HR 55 | Ht 62.0 in | Wt 154.0 lb

## 2014-12-20 DIAGNOSIS — E119 Type 2 diabetes mellitus without complications: Secondary | ICD-10-CM | POA: Insufficient documentation

## 2014-12-20 DIAGNOSIS — I120 Hypertensive chronic kidney disease with stage 5 chronic kidney disease or end stage renal disease: Secondary | ICD-10-CM | POA: Insufficient documentation

## 2014-12-20 DIAGNOSIS — E785 Hyperlipidemia, unspecified: Secondary | ICD-10-CM | POA: Diagnosis not present

## 2014-12-20 DIAGNOSIS — Z4931 Encounter for adequacy testing for hemodialysis: Secondary | ICD-10-CM | POA: Insufficient documentation

## 2014-12-20 DIAGNOSIS — N186 End stage renal disease: Secondary | ICD-10-CM

## 2014-12-20 DIAGNOSIS — N184 Chronic kidney disease, stage 4 (severe): Secondary | ICD-10-CM

## 2014-12-20 NOTE — Progress Notes (Signed)
She presents today for follow-up of her left AV fistula creation by myself on 11/21/2014. She reports that she is still not on hemodialysis. He has minimal discomfort associated with her incision. She is here today with her husband.  Physical exam her wrist incision is well-healed. She does have a nice thrill throughout her forearm and the dilatation of the vein in her forearm extending up to the antecubital space.  She did undergo duplex today and this shows diameter in the 3-4 mm range in her forearm. Pain is in the 4-5 mm range above the antecubital space.  Impression and plan: Had long discussion with patient and her husband present. She is one month out. She has excellent flow through the fistula but the only moderate maturation currently. I explained that this may be to the point where it is acceptable for access to walk take additional time to see. Did discuss some hand exercises to hopefully improve size. We will see her again in 6 weeks for continued discussion. Also understands may need to convert this to an upper arm fistula depending on her size maturation at that time

## 2015-01-26 ENCOUNTER — Ambulatory Visit (INDEPENDENT_AMBULATORY_CARE_PROVIDER_SITE_OTHER): Payer: Medicare Other | Admitting: Cardiology

## 2015-01-26 ENCOUNTER — Encounter: Payer: Self-pay | Admitting: Cardiology

## 2015-01-26 VITALS — BP 175/78 | HR 54 | Ht 63.0 in | Wt 161.8 lb

## 2015-01-26 DIAGNOSIS — I2510 Atherosclerotic heart disease of native coronary artery without angina pectoris: Secondary | ICD-10-CM

## 2015-01-26 DIAGNOSIS — N184 Chronic kidney disease, stage 4 (severe): Secondary | ICD-10-CM | POA: Diagnosis not present

## 2015-01-26 DIAGNOSIS — R011 Cardiac murmur, unspecified: Secondary | ICD-10-CM

## 2015-01-26 NOTE — Progress Notes (Signed)
Cardiology Office Note  Date: 01/26/2015   ID: [name], DOB [birth year], MRN [id number]  PCP: Octavio Graves, DO  Primary Cardiologist: Rozann Lesches, MD   Chief Complaint  Patient presents with  . Coronary Artery Disease    History of Present Illness: [name] is a 70 y.o. female last seen in October 2016. She presents with her husband for a follow-up visit. From a cardiac perspective, she reports good symptom control with no angina symptoms on present medical regimen. She has had other health concerns, most pressing has been diagnosis of malignant melanoma in her pubic region. She has already undergone surgery and has had recurrence. She has a visit in [place] tomorrow to discuss the next step.  She underwent left radiocephalic AV fistula creation in November 2016 with Dr. Donnetta Hutching. He continues to follow with nephrology, has not required hemodialysis as yet.  We reviewed her medications. Cardiac regimen includes aspirin, Plavix, Norvasc, Coreg, clonidine, hydralazine, Imdur, Zaroxolyn, Diovan, and Demadex.  Past Medical History  Diagnosis Date  . Fibromyalgia   . Hypothyroidism   . Diabetes mellitus, type 2 ([place])   . Coronary atherosclerosis of native coronary artery     BMS circ/RCA 2001, BMS RCA 2003, PTCA RCA/ramus 2004, DES RCA 2005, DES RCA 2007, DES OM/LAD 2009, DES RCA 7/11, LVEF 65%  . Hyperlipidemia   . Essential hypertension, benign   . MI (myocardial infarction) ([place])   . Peripheral arterial disease ([place])   . History of pneumonia   . CKD (chronic kidney disease), stage IV ([place])   . Melanoma ([place])   . Retinal hemorrhage of both eyes   . Back pain     Current Outpatient Prescriptions  Medication Sig Dispense Refill  . amitriptyline (ELAVIL) 10 MG tablet Take 30 mg by mouth at bedtime. Takes in addition to the 100mg  at bedtime.    Marland Kitchen amitriptyline (ELAVIL) 100 MG tablet Take 100 mg by mouth at bedtime.      Marland Kitchen amLODipine (NORVASC) 10 MG  tablet Take 10 mg by mouth daily.      Marland Kitchen aspirin 81 MG tablet Take 81 mg by mouth at bedtime.     . carvedilol (COREG) 25 MG tablet Take 25 mg by mouth 2 (two) times daily.      . Cholecalciferol (VITAMIN D) 2000 UNITS CAPS Take 2,000 Units by mouth every morning.     . cloNIDine (CATAPRES) 0.3 MG tablet Take 0.3 mg by mouth daily.     . clopidogrel (PLAVIX) 75 MG tablet Take 75 mg by mouth every morning.     Marland Kitchen co-enzyme Q-10 30 MG capsule Take 30 mg by mouth daily.    Marland Kitchen glyBURIDE micronized (GLYNASE) 6 MG tablet Take 6 mg by mouth daily.    . hydrALAZINE (APRESOLINE) 25 MG tablet Take 25 mg by mouth 3 (three) times daily.    Marland Kitchen HYDROcodone-acetaminophen (NORCO) 7.5-325 MG tablet Take 1 tablet by mouth every 6 (six) hours as needed for moderate pain.    . Hypromellose (ARTIFICIAL TEARS OP) Place 2 drops into both eyes as needed (for dry eyes).    . insulin glargine (LANTUS) 100 UNIT/ML injection Inject 0.15 mLs (15 Units total) into the skin at bedtime. *Based on blood sugar levels (Patient taking differently: Inject 15-25 Units into the skin at bedtime. *Based on blood sugar levels - sliding scale) 10 mL 11  . insulin glulisine (APIDRA) 100 UNIT/ML injection Inject 5 Units into the skin 3 (three) times  daily after meals. After breakfast & lunch.    . isosorbide mononitrate (IMDUR) 60 MG 24 hr tablet TAKE 60MG  IN THE AM AND 30MG  IN THE PM (TAKE HALF TABLET IF SCORED) (Patient taking differently: Take 30-60 mg by mouth 2 (two) times daily. TAKE 60MG  IN THE AM AND 30MG  IN THE PM (TAKE HALF TABLET IF SCORED)) 135 tablet 6  . levothyroxine (SYNTHROID, LEVOTHROID) 150 MCG tablet Take 150 mcg by mouth daily before breakfast.    . metolazone (ZAROXOLYN) 5 MG tablet Take 5 mg by mouth 2 (two) times daily.     . nabumetone (RELAFEN) 750 MG tablet Take 750 mg by mouth 2 (two) times daily.    . nitroGLYCERIN (NITROSTAT) 0.4 MG SL tablet Place 1 tablet (0.4 mg total) under the tongue every 5 (five) minutes x 3  doses as needed. 25 tablet 3  . Omega-3 Fatty Acids (FISH OIL) 1000 MG CAPS Take 2,000 mg by mouth 2 (two) times daily.     . polyethylene glycol powder (MIRALAX) powder Take 17 g by mouth daily as needed for mild constipation. In 8 oz of water or juice    . torsemide (DEMADEX) 10 MG tablet Take 5 tablets (50 mg total) by mouth daily.    . valsartan (DIOVAN) 320 MG tablet Take 320 mg by mouth every morning.     . vitamin C (ASCORBIC ACID) 500 MG tablet Take 500 mg by mouth 2 (two) times daily.     . vitamin E 400 UNIT capsule Take 400 Units by mouth daily.       No current facility-administered medications for this visit.   Allergies:  Sulfonamide derivatives and Pregabalin   Social History: The patient  reports that she has never smoked. She has never used smokeless tobacco. She reports that she does not drink alcohol or use illicit drugs.   ROS:  Please see the history of present illness. Otherwise, complete review of systems is positive for fatigue.  All other systems are reviewed and negative.   Physical Exam: VS:  BP 175/78 mmHg  Pulse 54  Ht 5\' 3"  (1.6 m)  Wt 161 lb 12.8 oz (73.392 kg)  BMI 28.67 kg/m2  SpO2 97%, BMI Body mass index is 28.67 kg/(m^2).  Wt Readings from Last 3 Encounters:  01/26/15 161 lb 12.8 oz (73.392 kg)  12/20/14 154 lb (69.854 kg)  11/21/14 165 lb (74.844 kg)    Appears comfortable at rest.  HEENT: Conjunctiva and lids normal, oropharynx clear.  Neck: Supple, no elevated JVP or carotid bruits, no thyromegaly.  Lungs: Clear to auscultation, nonlabored breathing at rest.  Cardiac: Regular rate and rhythm, no S3, 2/6 basal systolic murmur, no pericardial rub.  Abdomen: Soft, protuberant, nontender, bowel sounds present, no guarding or rebound.  Extremities: 2+ edema, distal pulses 1+.   ECG: Tracing from 06/20/2014 showed sinus rhythm with nonspecific ST-T changes.  Recent Labwork: 06/20/2014: B Natriuretic Peptide 267.0*; TSH 1.461 06/21/2014:  ALT 21; AST 18 06/23/2014: BUN 49*; Creatinine, Ser 2.76*; Platelets 253 11/21/2014: Hemoglobin 10.2*; Potassium 3.2*; Sodium 140   Other Studies Reviewed Today:  Echocardiogram from August 2015 reported moderate to severe LVH with LVEF 60-65%, increased left atrial pressure, sclerotic aortic valve without stenosis, mild mitral regurgitation.  Assessment and Plan:  1. Symptomatically stable CAD status post multiple prior percutaneous interventions as detailed above. Fortunately, she continues to do well on present medical therapy. Blood pressure is up today, she does have for the room to adjust hydralazine  if needed. We plan to continue observation for now. She will be seeing Dr. Melina Copa soon for a physical with lab work.  2. CKD stage IV, status post placement of left arm AV fistula. She has not yet required hemodialysis and continues to follow with nephrology.  3. Stable cardiac murmur with history of sclerotic aortic valve, no stenosis.  Current medicines were reviewed with the patient today.  Disposition: FU with me in 6 months.   Signed, Satira Sark, MD, [facility] 01/26/2015 3:19 PM    [facility] [place], [place], [place] [address] Phone: [phone]; Fax: [phone]

## 2015-01-26 NOTE — Patient Instructions (Signed)
Your physician wants you to follow-up in: 6 months with Dr. McDowell You will receive a reminder letter in the mail two months in advance. If you don't receive a letter, please call our office to schedule the follow-up appointment.  Your physician recommends that you continue on your current medications as directed. Please refer to the Current Medication list given to you today.  Thank you for choosing [facility]!!    

## 2015-02-08 ENCOUNTER — Encounter: Payer: Self-pay | Admitting: Vascular Surgery

## 2015-02-14 ENCOUNTER — Encounter: Payer: Self-pay | Admitting: Vascular Surgery

## 2015-02-14 ENCOUNTER — Ambulatory Visit (INDEPENDENT_AMBULATORY_CARE_PROVIDER_SITE_OTHER): Payer: Medicare Other | Admitting: Vascular Surgery

## 2015-02-14 VITALS — BP 144/68 | HR 50 | Ht 63.0 in | Wt 161.0 lb

## 2015-02-14 DIAGNOSIS — N184 Chronic kidney disease, stage 4 (severe): Secondary | ICD-10-CM

## 2015-02-14 NOTE — Progress Notes (Signed)
Patient presents today for continued follow-up of her left arm AV fistula creation in mid November. I'd seen her proximally 6 weeks ago and the fistula was patent but had a moderate size maturation. She is seen today for further follow-up of this to determine if this is going to be usable. She reports that her renal function currently is stable but the stage IV.  Since my last visit she has been diagnosis with recurrence of metastatic melanoma. Her husband is with her and reports that this is widespread in her brain resulting in a hemorrhage also in her lungs and multiple other sites.  Physical exam her left forearm cephalic vein fistula is patent. She does have a good thrill through this. The size continues to be marginal at approximately 4-5 mm.  Impression and plan recently diagnosed with metastatic melanoma. Is being treated with both radiation treatment to her brain metastases and also immunotherapy do other widespread metastases. Obviously would not recommend any elective treatment for her fistula currently. If she continued to progress to renal failure and required hemodialysis access, would recommend hemodialysis catheter. The patient and her husband present understands and will see [place] again on as-needed basis

## 2015-04-08 DEATH — deceased

## 2016-05-31 IMAGING — CT CT CHEST W/O CM
2 of 3 series · 15 of 36 positions shown, 18 images · non-contrast
Comparison: Chest radiograph of same day.

CLINICAL DATA: Chest congestion, cough.  History of melanoma.

EXAM:
CT CHEST WITHOUT CONTRAST
TECHNIQUE: Multidetector CT imaging of the chest was performed following the
standard protocol without IV contrast.

[Series 2: chestroutine 5.0 b40f · axial · 0.69mm/px · z∈[-313,-13]mm · 12 of 72 slices shown, 15 images]
[im 6/72  mediastinal]
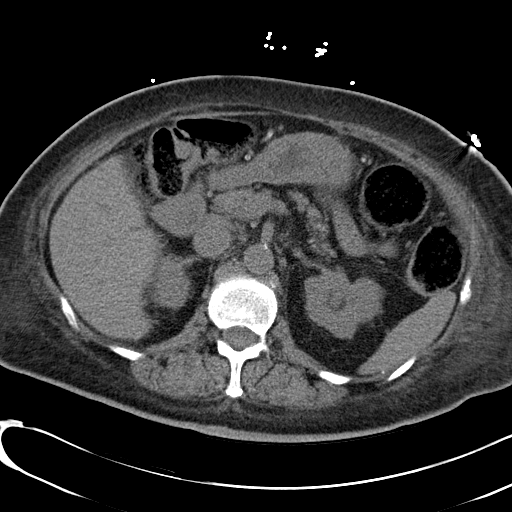
[im 6/72  lung]
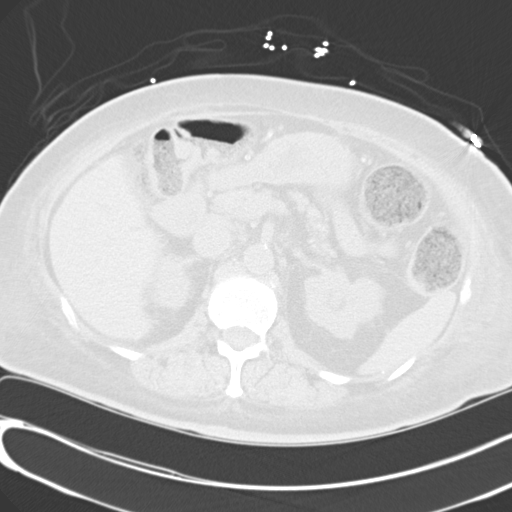
[im 11/72  lung]
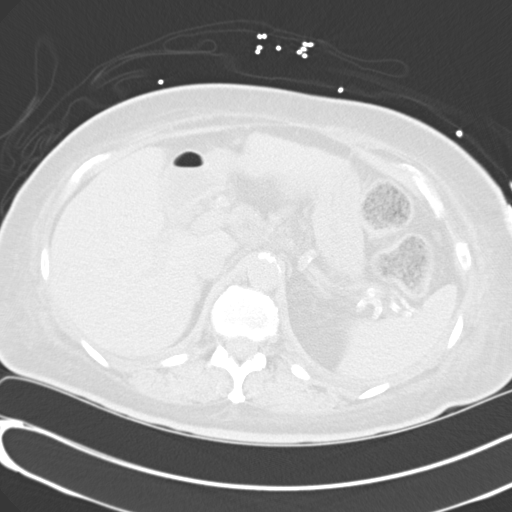
[im 16/72  lung]
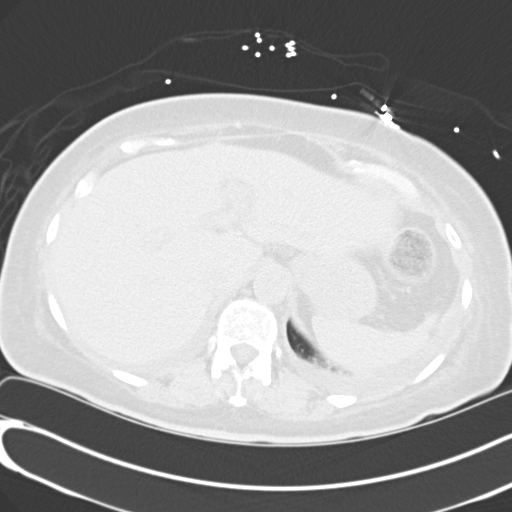
[im 22/72  lung]
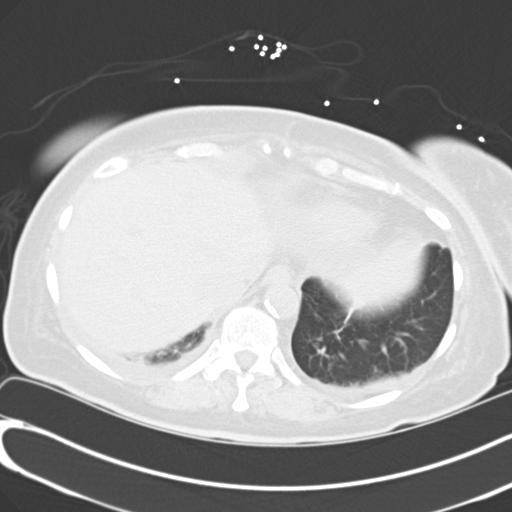
[im 27/72  mediastinal]
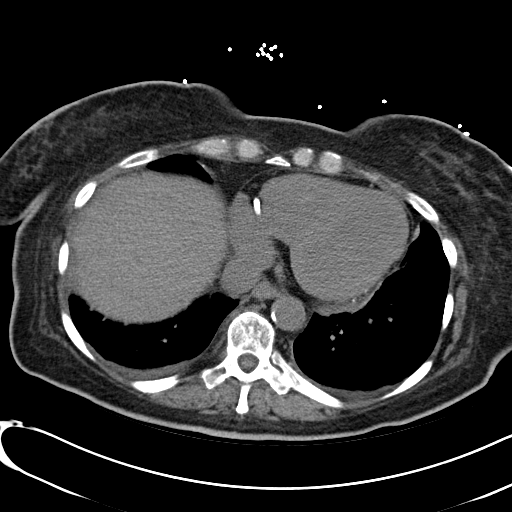
[im 27/72  lung]
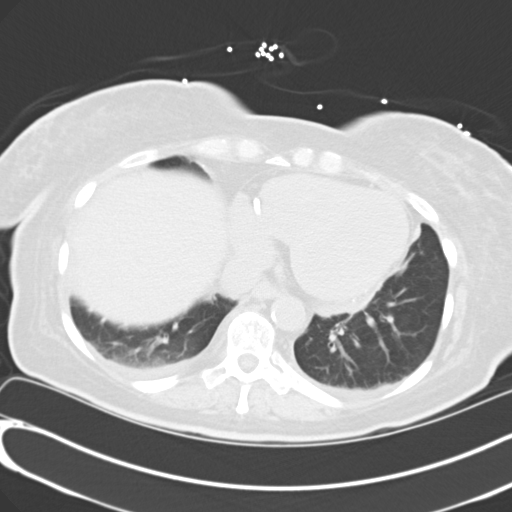
[im 32/72  lung]
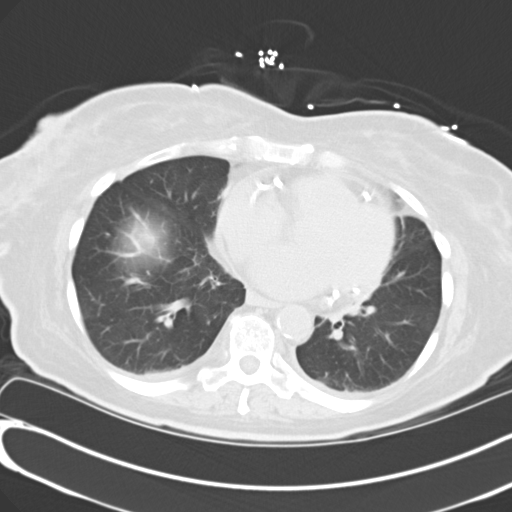
[im 40/72  lung]
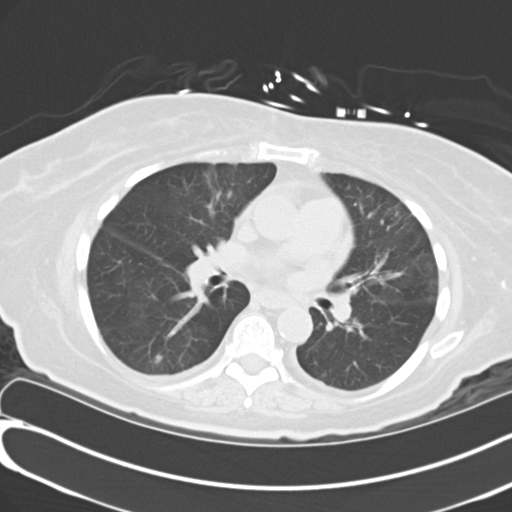
[im 45/72  lung]
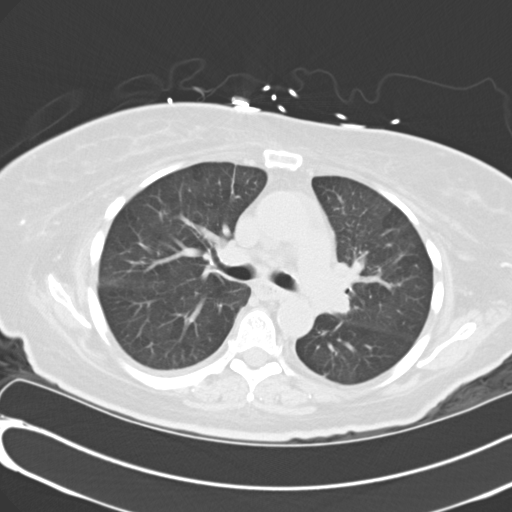
[im 50/72  mediastinal]
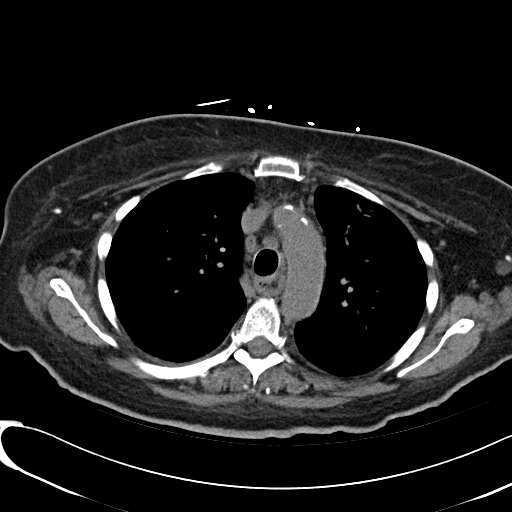
[im 50/72  lung]
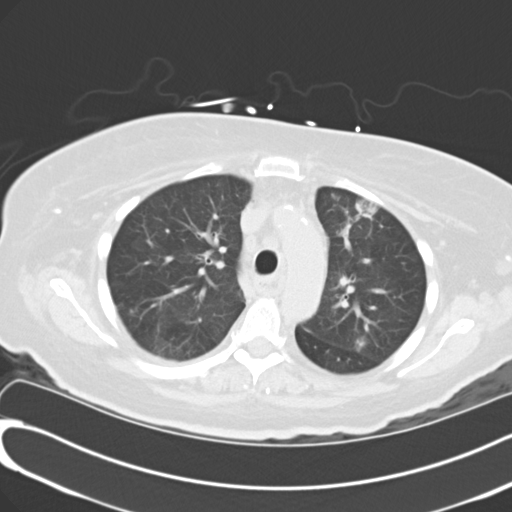
[im 56/72  lung]
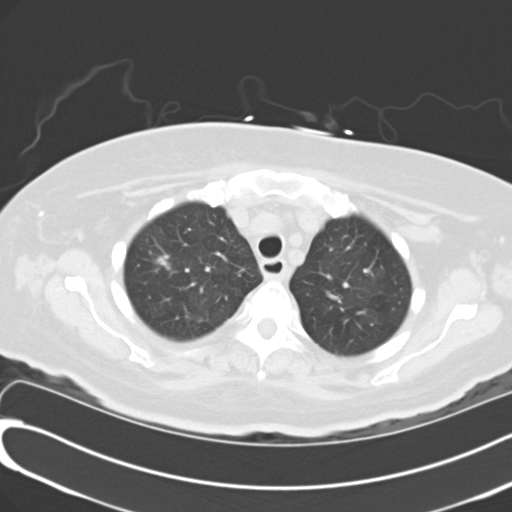
[im 61/72  lung]
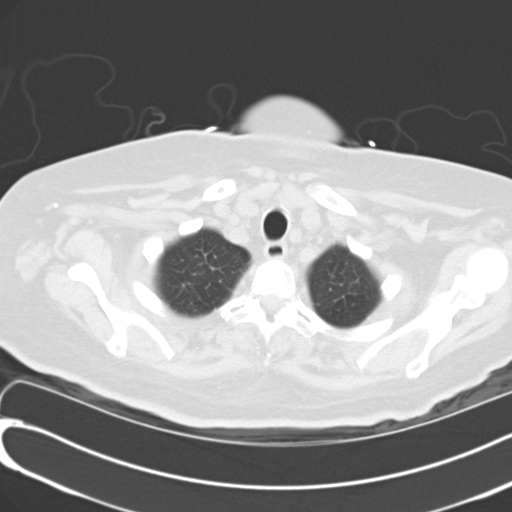
[im 66/72  lung]
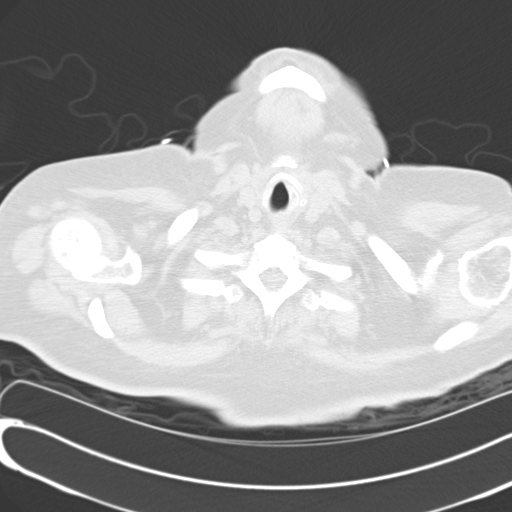

[Series 4: mpr coro 3mm · coronal · 0.69mm/px · 3 of 76 slices shown]
[im 16/76  lung]
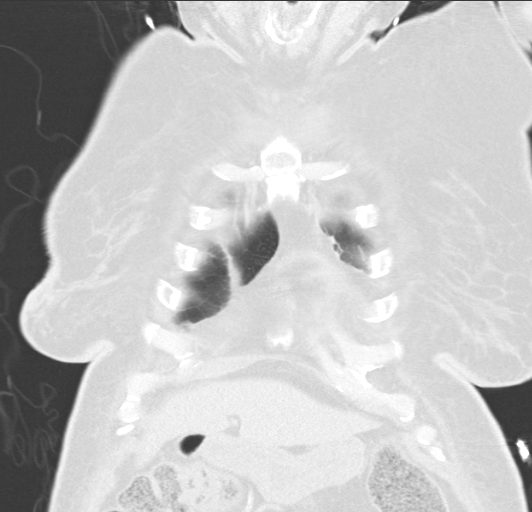
[im 31/76  lung]
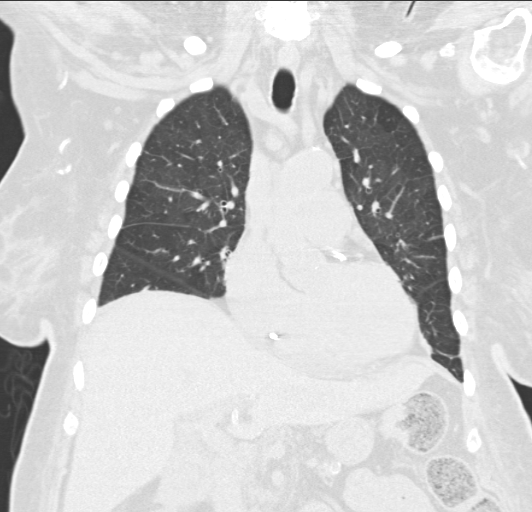
[im 46/76  lung]
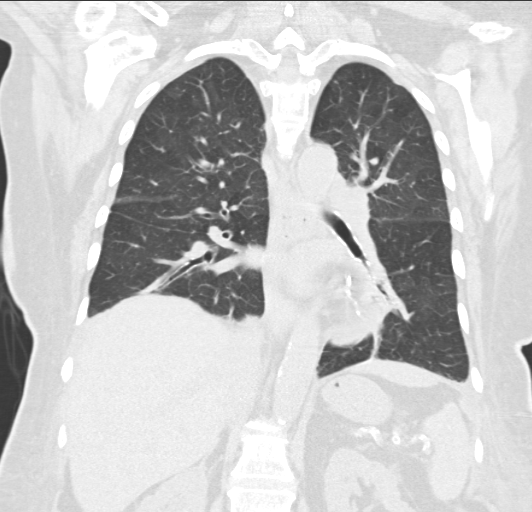

[15 of 36 positions shown; findings below may reference images not displayed]

FINDINGS: No pneumothorax is noted. Minimal bilateral pleural effusions are
noted with left greater than right. Multiple nodular and airspace
opacities are noted throughout both lungs. Largest nodular density
measuring 11 mm is noted in superior segment of right lower lobe. 8
mm nodule is noted medial and inferior to this. Ill-defined airspace
opacity is noted anteriorly in the left upper lobe as well as in
right lung apex. Ill-defined nodular opacity is seen anterior to
left major fissure in left upper lobe, with several smaller nodular
densities seen laterally in the left upper lobe. Large airspace
opacity is noted laterally in right upper lobe most consistent with
inflammation. Is uncertain if these findings represent multifocal
pneumonia, or possibly metastatic disease given the history of
melanoma. Coronary artery calcifications are noted suggesting
coronary artery disease. Atherosclerosis of thoracic aorta is noted
without aneurysm formation. No significant mediastinal mass or
adenopathy is noted. Visualized portion of upper abdomen appears
normal. No significant osseous abnormality is noted in the chest.
IMPRESSION: Minimal bilateral pleural effusions with left greater than right.

Multiple nodular and airspace opacities are noted throughout both
lung which may represent multifocal pneumonia, but metastatic
disease cannot be excluded given the history of melanoma. Follow-up
CT scan in 2-3 weeks is recommended to ensure resolution of these
abnormalities. If these abnormalities persist on followup CT scan,
this would be suspicious for metastatic disease.

## 2016-05-31 IMAGING — DX DG CHEST 2V
2 series · 2 of 2 positions shown · non-contrast
Comparison: 12/14/2012

CLINICAL DATA: Edema in feet, ankles and legs for 4-5 days, 8 lb
weight gain in past 6 days, coughing when lying flat, history type
II diabetes, MI, essential benign hypertension, history melanoma

EXAM:
CHEST  2 VIEW

[chest pa]
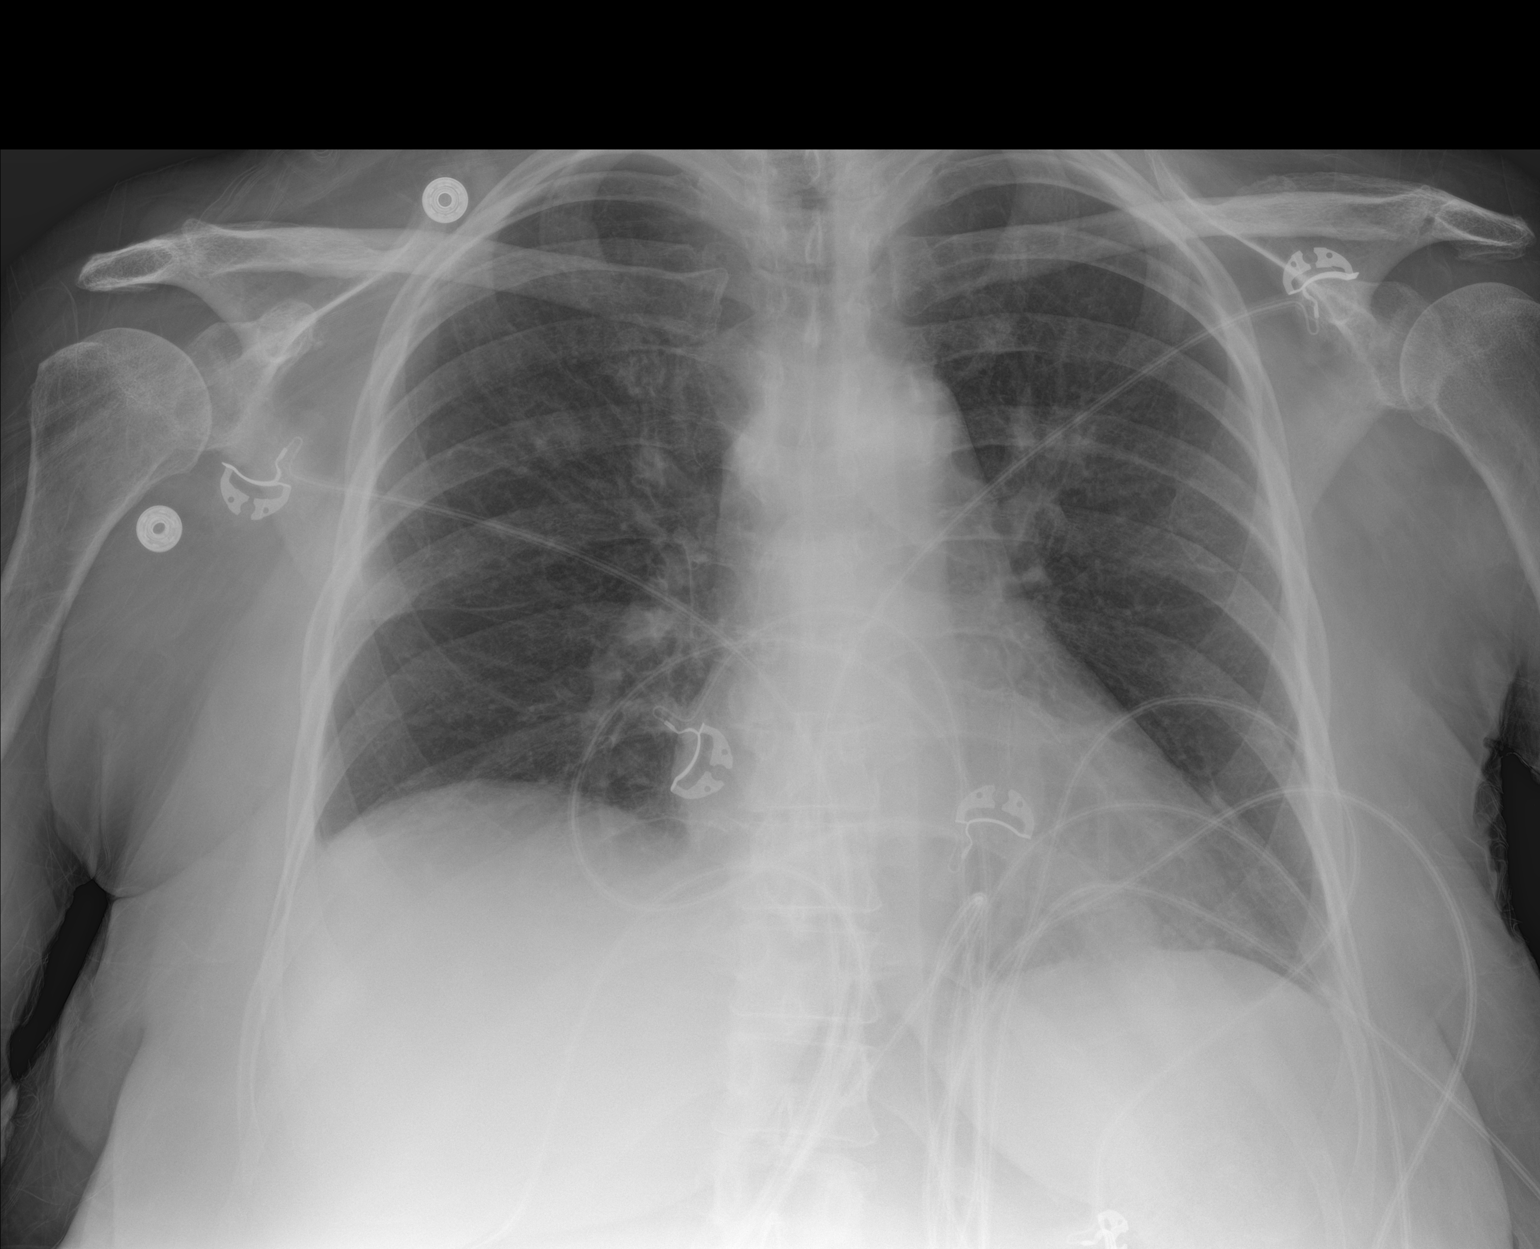

[chest lat]
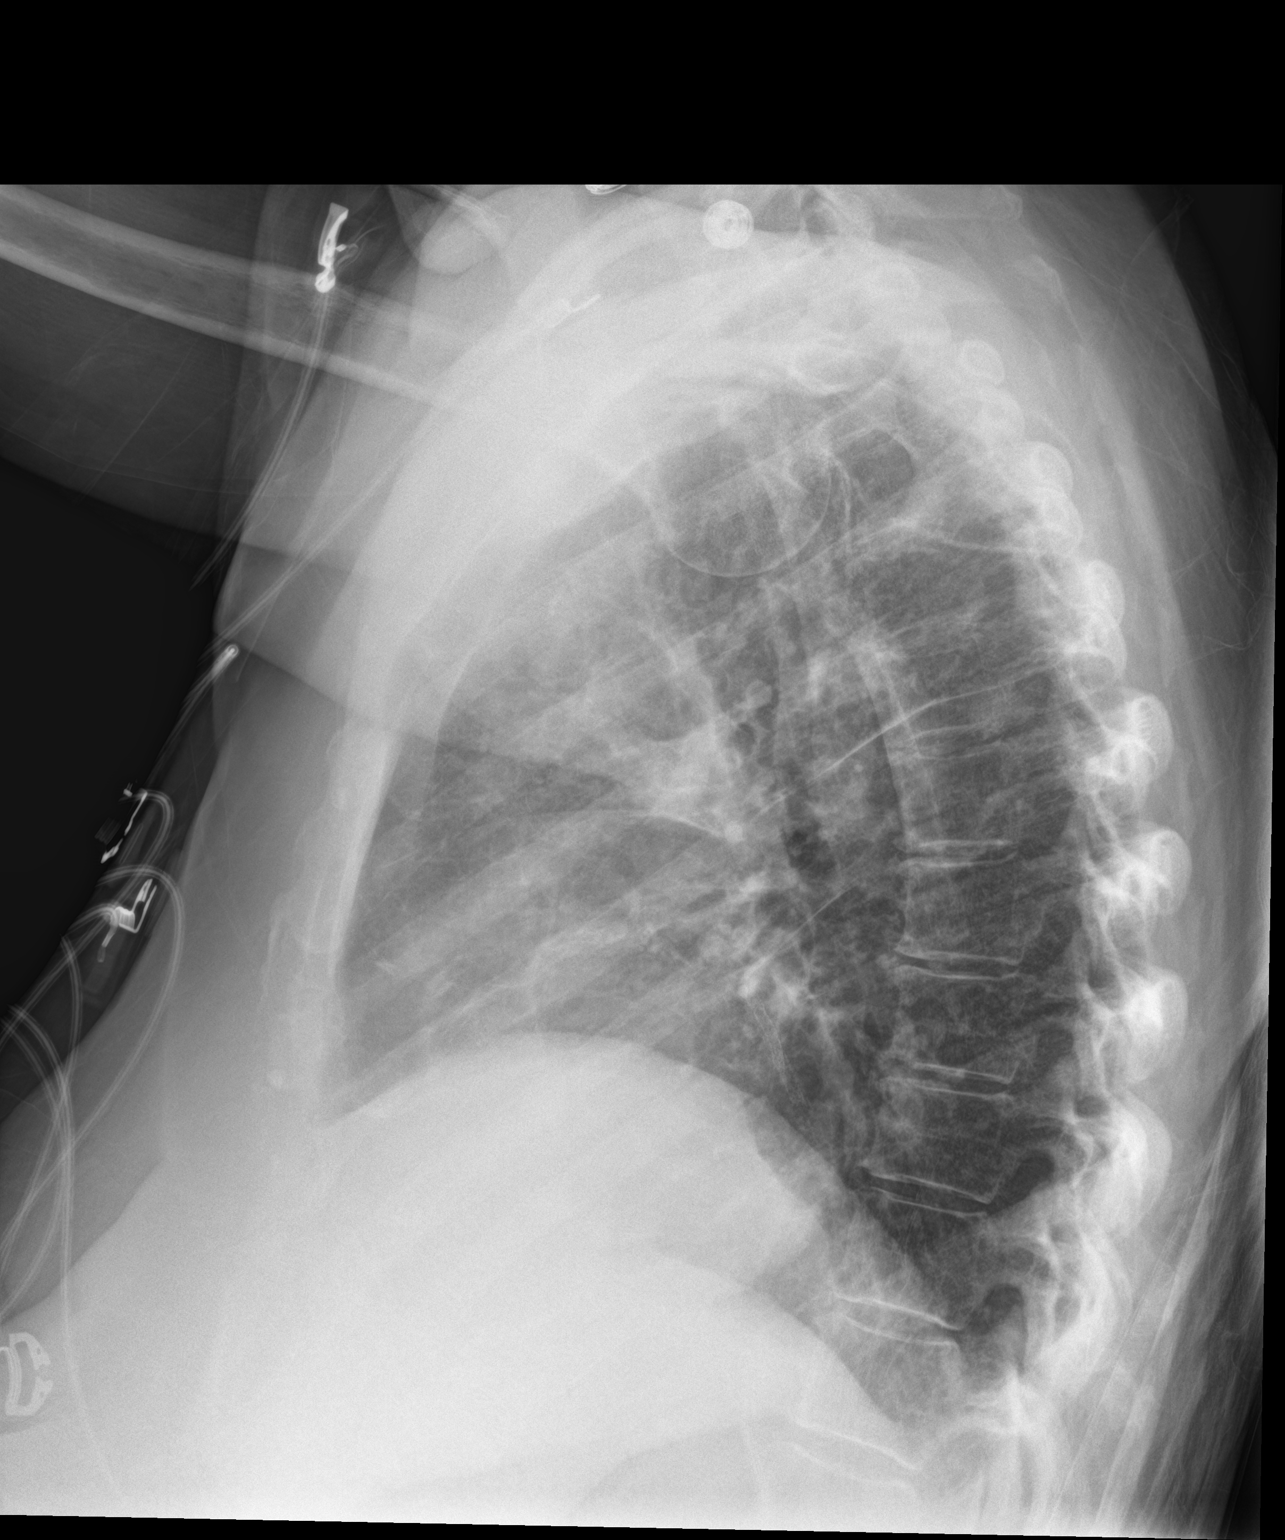

[2 of 2 positions shown; findings below may reference images not displayed]

FINDINGS: Normal heart size, mediastinal contours, and pulmonary vascularity.

Question minimal infiltrate in both upper lobes and at LEFT base.

No pleural effusion or pneumothorax.

Bones unremarkable.
IMPRESSION: Suspect minimal BILATERAL upper lobe and questionable LEFT basilar
infiltrates concerning for pneumonia.

In light of history of melanoma, radiographic followup recommended
in 3-4 weeks recommended to ensure resolution.

## 2016-06-01 IMAGING — MR MR LUMBAR SPINE W/O CM
4 of 5 series · 15 of 48 positions shown · non-contrast
Comparison: Lumbar MRI 07/08/2012.

CLINICAL DATA: Leg weakness with decreased ambulation for 1 year.
No acute injury or prior relevant surgery. History of chronic kidney
disease and diabetes. Initial encounter.

EXAM:
MRI LUMBAR SPINE WITHOUT CONTRAST
TECHNIQUE: Multiplanar, multisequence MR imaging of the lumbar spine was
performed. No intravenous contrast was administered.

[Series 3: T2 · sagittal · 4.0mm · 0.72mm/px · 6 of 15 slices shown (1 of 2)]
[im 1/15]
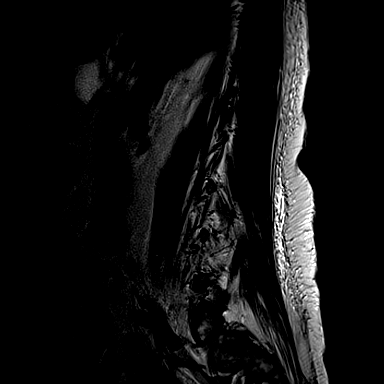
[im 3/15]
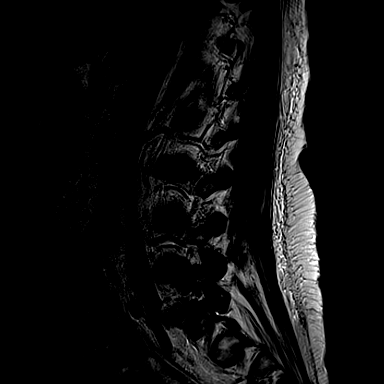
[im 6/15]
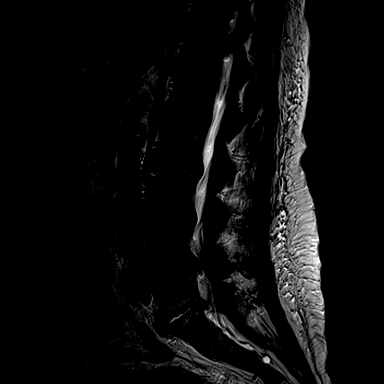
[im 9/15]
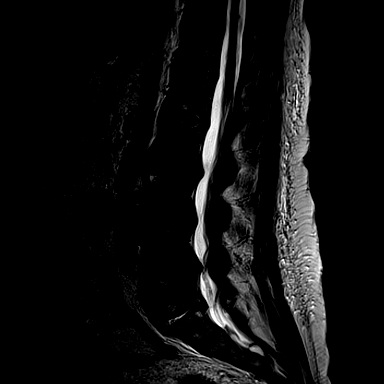
[im 12/15]
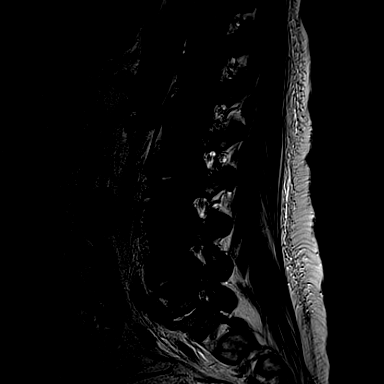
[im 15/15]
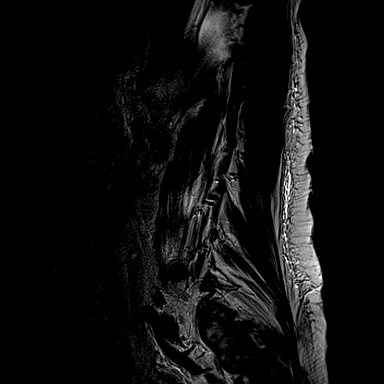

[Series 4: T1 · sagittal · 4.0mm · 0.36mm/px · 3 of 15 slices shown (1 of 2)]
[im 3/15]
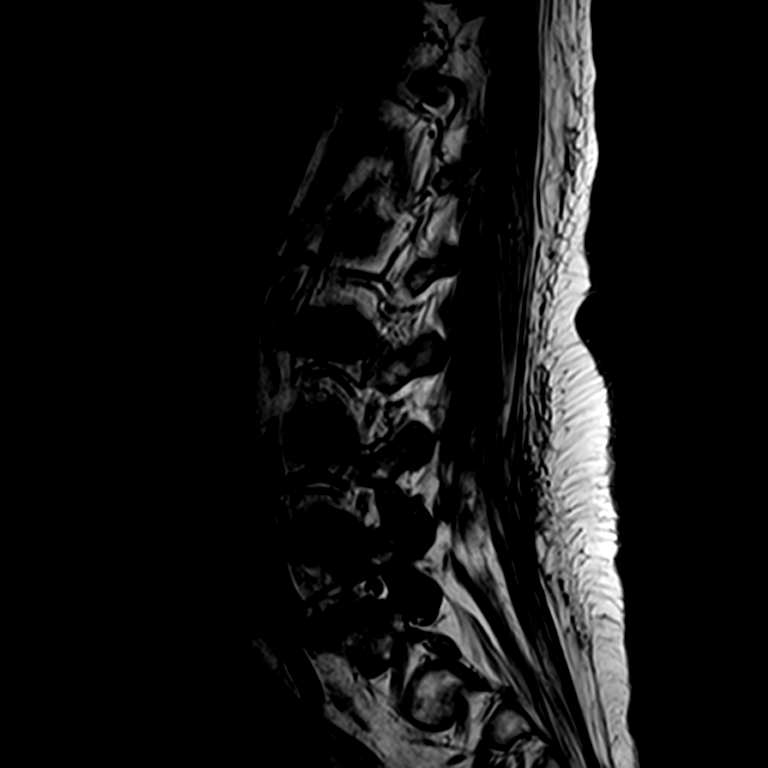
[im 9/15]
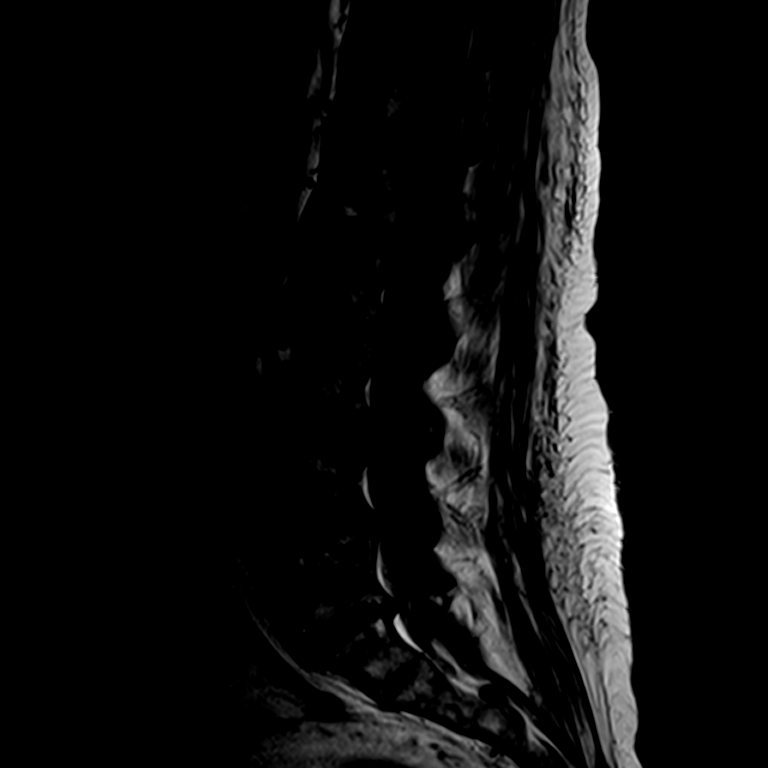
[im 15/15]
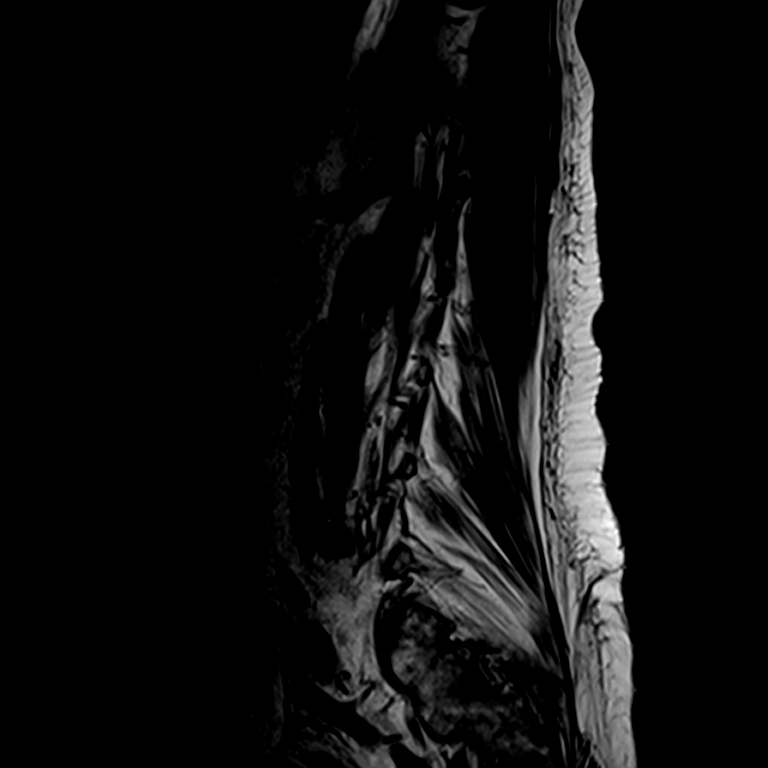

[Series 6: T2 · axial · 4.0mm · 0.21mm/px · z∈[-141,-11]mm · 3 of 39 slices shown (2 of 2)]
[im 6/39]
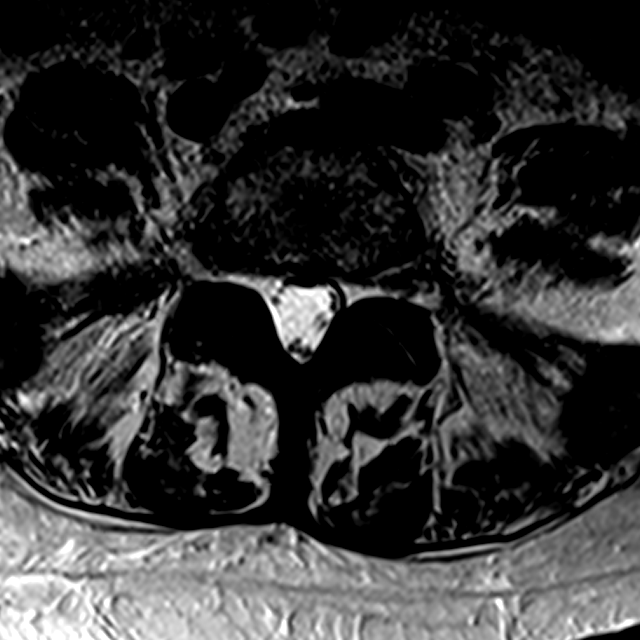
[im 20/39]
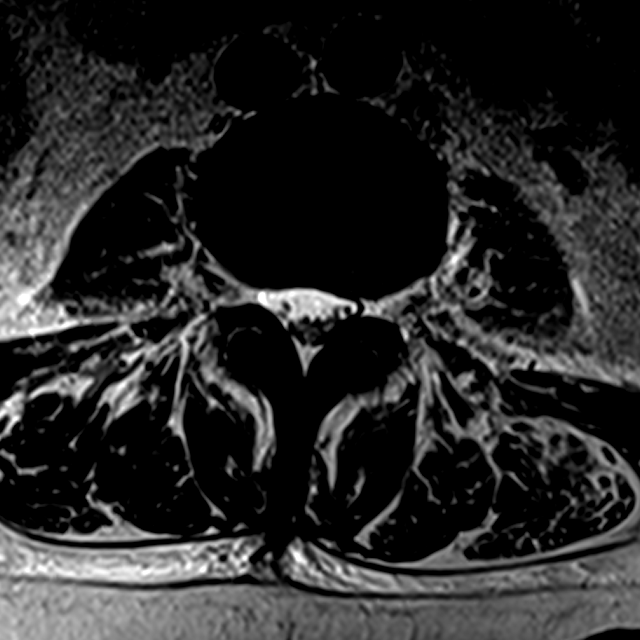
[im 33/39]
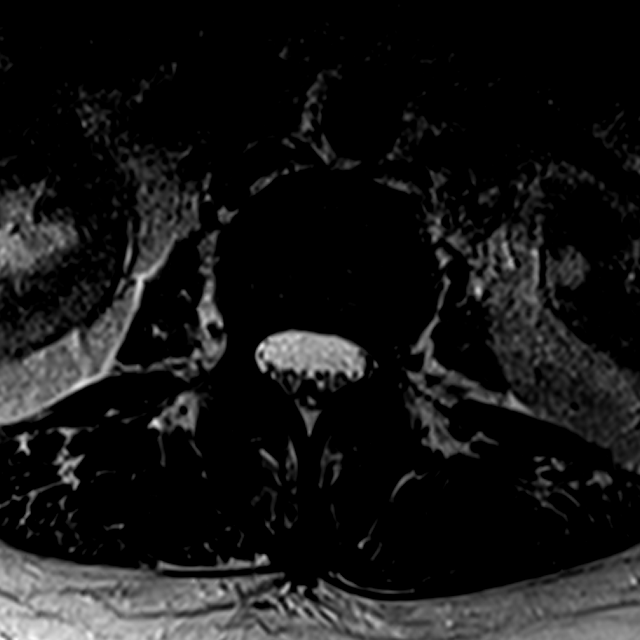

[Series 7: T1 · axial · 4.0mm · 0.25mm/px · z∈[-144,-11]mm · 3 of 39 slices shown (2 of 2)]
[im 6/39]
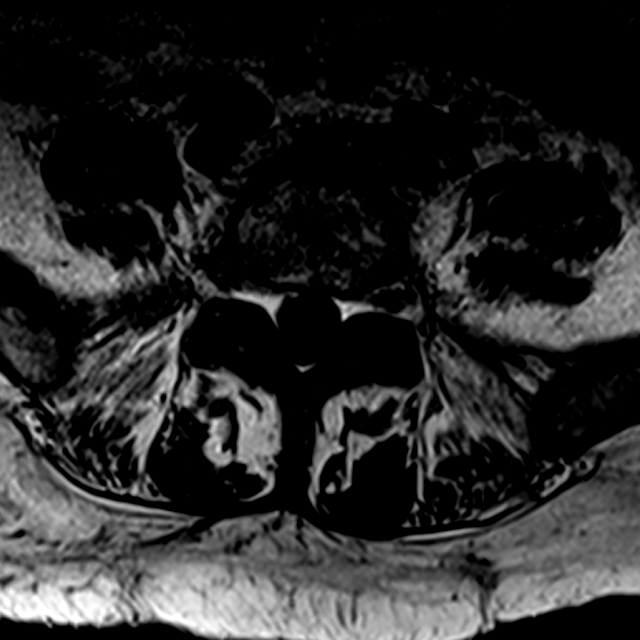
[im 20/39]
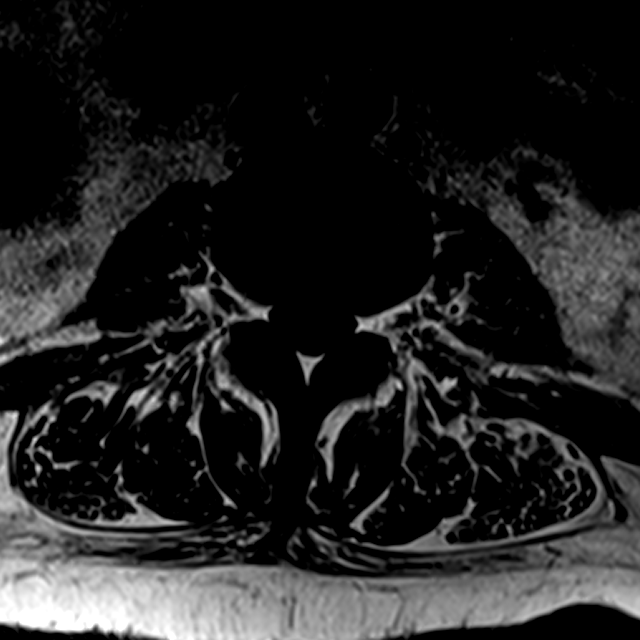
[im 33/39]
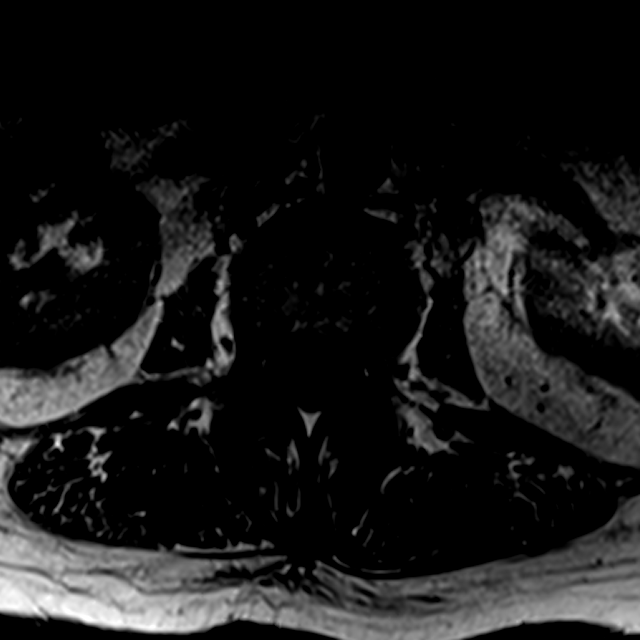

[15 of 48 positions shown; findings below may reference images not displayed]

FINDINGS: Five lumbar type vertebral bodies are assumed. The alignment is
normal. There is no evidence of acute fracture or pars defect. There
is progressive diffuse marrow heterogeneity attributed to renal
osteodystrophy or anemia. No focally suspicious lesions identified
on inversion recovery images. Chronic discal hyperintensity again
noted at L5-S1. Adjacent type 2 endplate changes are stable. There
is no evidence of endplate destruction.

The conus medullaris extends to the L1-2 level and appears normal.
No focal paraspinal abnormalities identified. There is generalized
mesenteric, subcutaneous and retroperitoneal soft tissue edema.

No significant disc space findings from T11-12 through L1-2.

L2-3: With minimally progressive annular disc bulging eccentric to
the left. The resulting borderline spinal stenosis is unchanged.
There is no foraminal compromise or definite nerve root
encroachment.

L3-4: Stable mild disc bulging, facet and ligamentous hypertrophy.
No spinal stenosis or nerve root encroachment.

L4-5: Mildly progressive annular disc bulging, facet and ligamentous
hypertrophy. The resulting central stenosis is slightly progressive,
although still moderate. There is mild narrowing of the lateral
recesses, left greater than right. The foramina are patent.

L5-S1: Slowly progressive chronic disc and endplate degeneration.
Chronic disc fragment in the right subarticular zone is unchanged.
This may contribute to chronic right S1 nerve root encroachment.
There is stable mild foraminal narrowing bilaterally due to disc
bulging and osteophytes.
IMPRESSION: 1. Compared with the prior study from 2 years ago, there progressive
diffuse marrow changes attributed to renal failure/anemia. No acute
osseous findings.
2. No acute findings demonstrated within the lumbar spine.
3. Mildly progressive annular disc bulging, facet and ligamentous
hypertrophy at L4-5 contributing to moderate multifactorial spinal
stenosis and mild narrowing of the lateral recesses.
4. Mildly progressive chronic disc and endplate degeneration at
L5-S1 with stable small disc fragment in the right subarticular
zone.
